# Patient Record
Sex: Female | Born: 1975
Health system: Southern US, Community
[De-identification: ages and names within clinical notes are randomized; demographics above are authoritative.]

## PROBLEM LIST (undated history)

## (undated) DIAGNOSIS — R112 Nausea with vomiting, unspecified: Secondary | ICD-10-CM

## (undated) DIAGNOSIS — K219 Gastro-esophageal reflux disease without esophagitis: Secondary | ICD-10-CM

## (undated) DIAGNOSIS — Z9889 Other specified postprocedural states: Secondary | ICD-10-CM

## (undated) HISTORY — DX: Other specified postprocedural states: Z98.890

## (undated) HISTORY — DX: Nausea with vomiting, unspecified: R11.2

## (undated) HISTORY — PX: TYMPANOSTOMY: SHX2586

## (undated) HISTORY — PX: WISDOM TOOTH EXTRACTION: SHX21

## (undated) HISTORY — DX: Gastro-esophageal reflux disease without esophagitis: K21.9

---

## 2003-01-13 ENCOUNTER — Other Ambulatory Visit: Admission: RE | Admit: 2003-01-13 | Discharge: 2003-01-13 | Payer: Self-pay | Admitting: *Deleted

## 2006-09-30 ENCOUNTER — Ambulatory Visit (HOSPITAL_COMMUNITY): Admission: RE | Admit: 2006-09-30 | Discharge: 2006-09-30 | Payer: Self-pay | Admitting: Obstetrics and Gynecology

## 2007-03-14 ENCOUNTER — Inpatient Hospital Stay (HOSPITAL_COMMUNITY): Admission: AD | Admit: 2007-03-14 | Discharge: 2007-03-14 | Payer: Self-pay | Admitting: Obstetrics and Gynecology

## 2007-07-12 ENCOUNTER — Ambulatory Visit (HOSPITAL_COMMUNITY): Admission: RE | Admit: 2007-07-12 | Discharge: 2007-07-12 | Payer: Self-pay | Admitting: Obstetrics and Gynecology

## 2007-08-06 ENCOUNTER — Ambulatory Visit (HOSPITAL_COMMUNITY): Admission: RE | Admit: 2007-08-06 | Discharge: 2007-08-06 | Payer: Self-pay | Admitting: Obstetrics and Gynecology

## 2007-11-29 ENCOUNTER — Inpatient Hospital Stay (HOSPITAL_COMMUNITY): Admission: RE | Admit: 2007-11-29 | Discharge: 2007-12-02 | Payer: Self-pay | Admitting: Obstetrics and Gynecology

## 2008-05-12 ENCOUNTER — Encounter: Admission: RE | Admit: 2008-05-12 | Discharge: 2008-07-13 | Payer: Self-pay | Admitting: Sports Medicine

## 2008-07-11 IMAGING — US US OB DETAIL+14 WK
1 series · 14 of 28 positions shown · non-contrast
Comparison: none

OBSTETRICAL ULTRASOUND:
 This ultrasound was performed in The [HOSPITAL], and the [facility] report will be stored to [REDACTED] PACS.

[Series 1: us ob detail+14 wk · 14 of 79 slices shown]
[im 3/79]
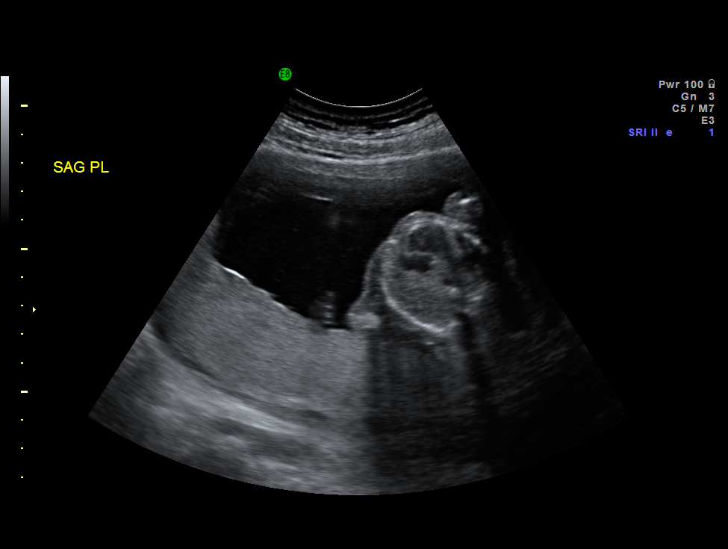
[im 9/79]
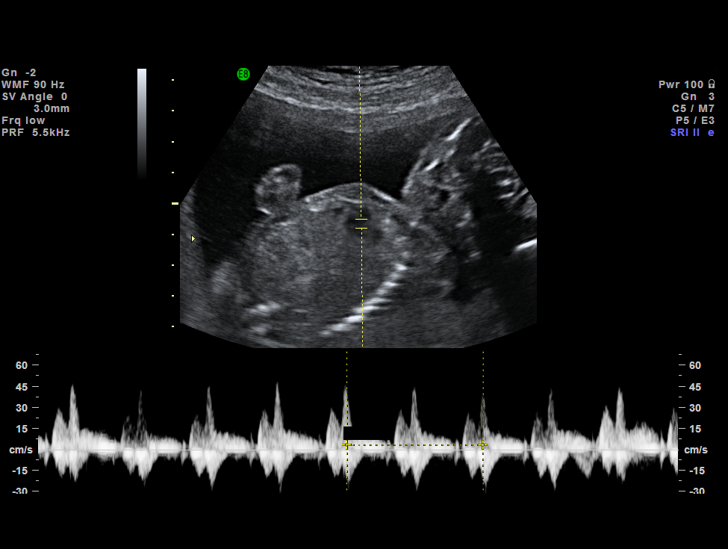
[im 15/79]
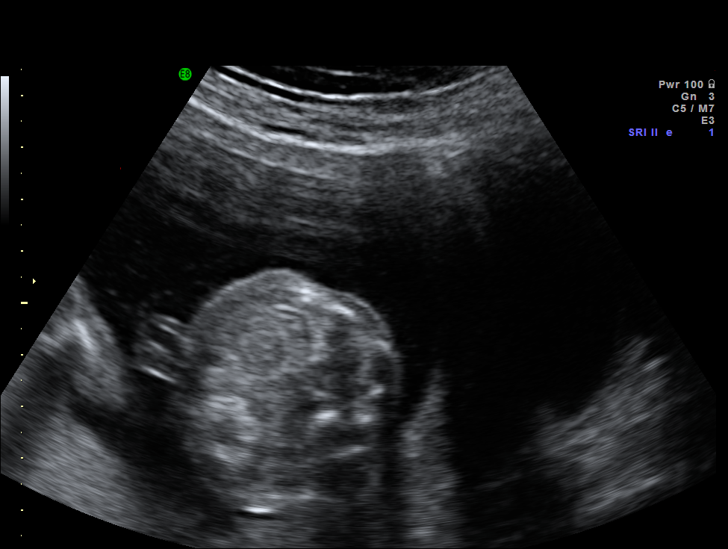
[im 21/79]
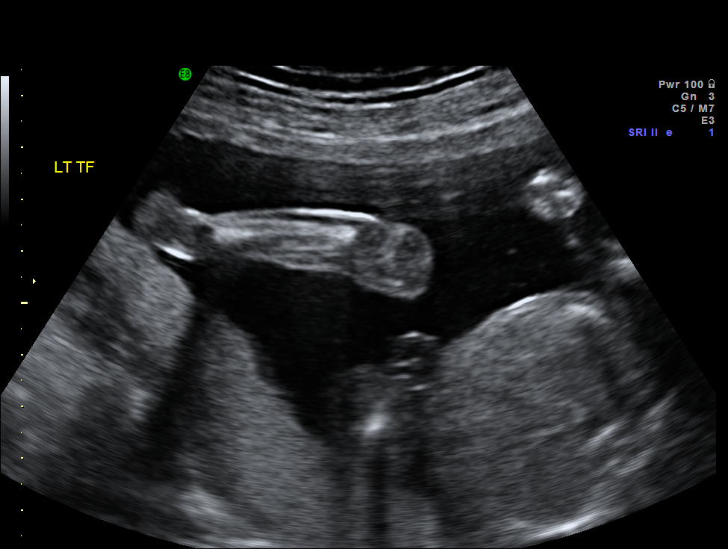
[im 27/79]
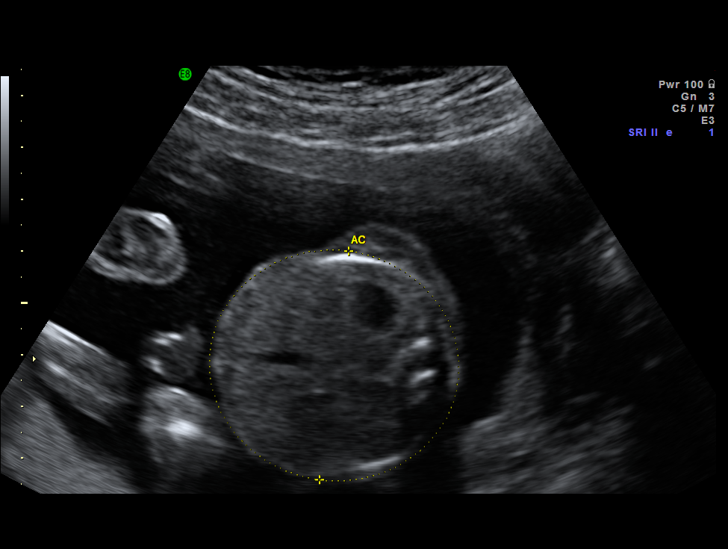
[im 32/79]
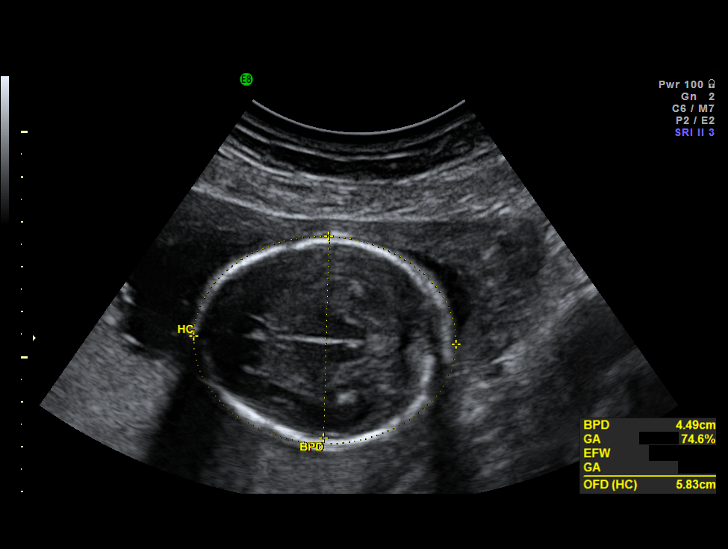
[im 38/79]
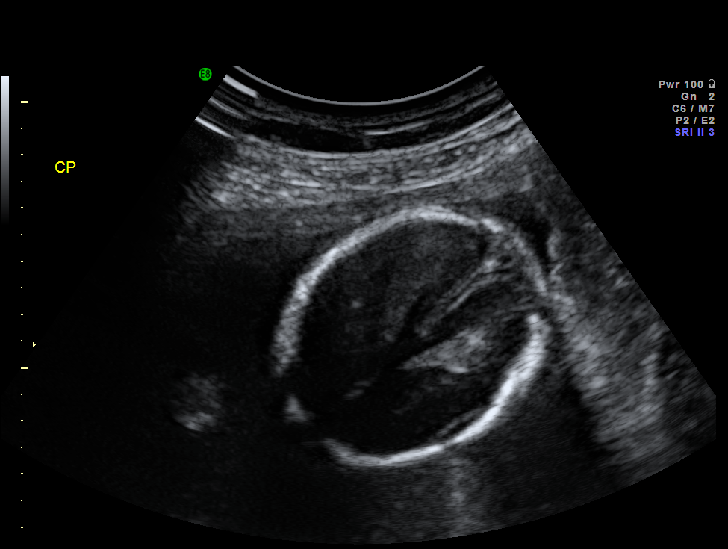
[im 44/79]
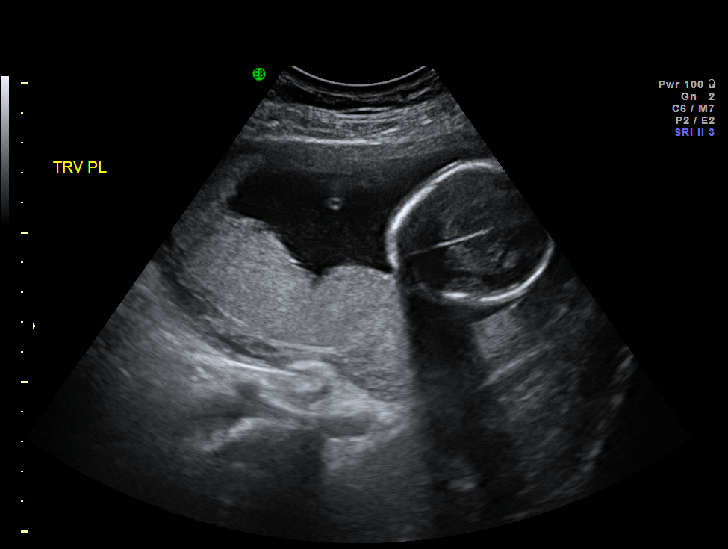
[im 50/79]
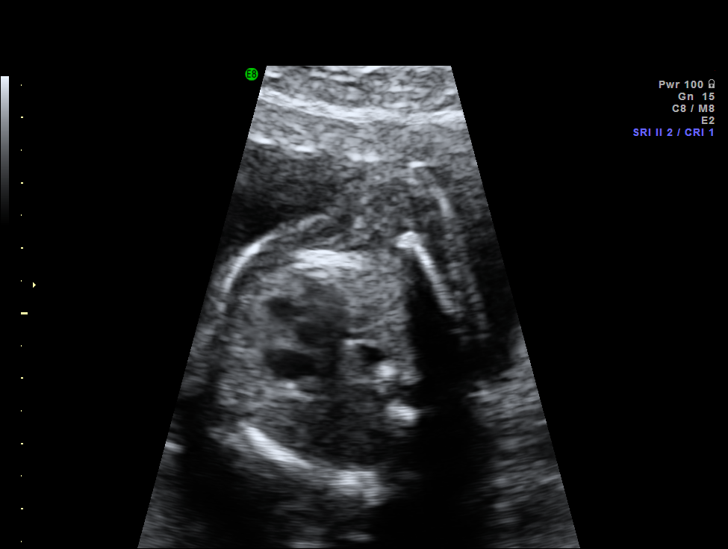
[im 55/79]
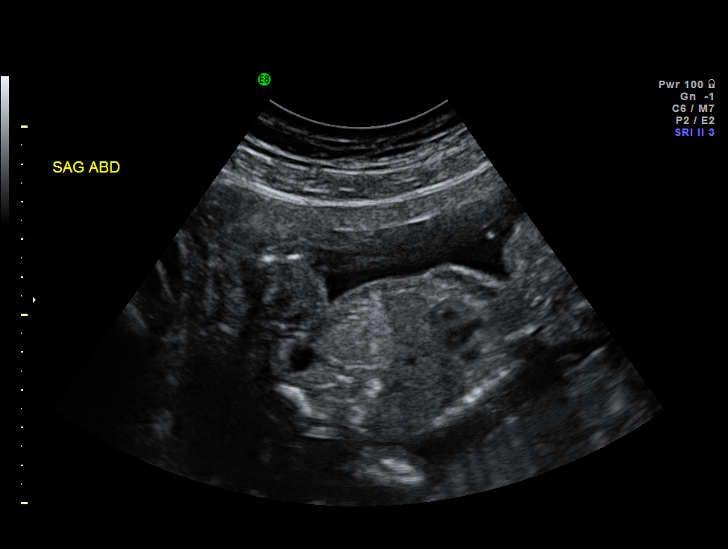
[im 61/79]
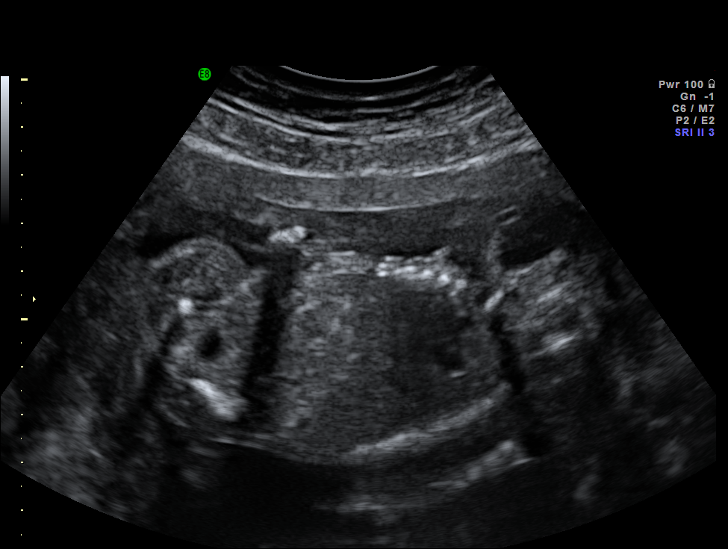
[im 67/79]
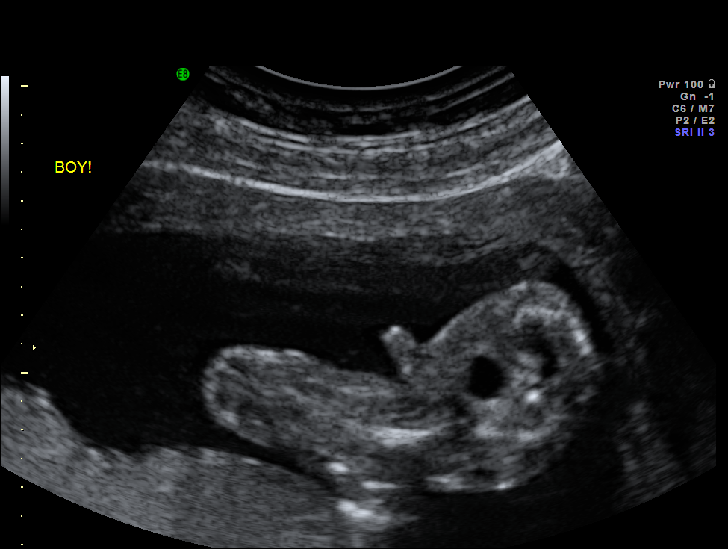
[im 73/79]
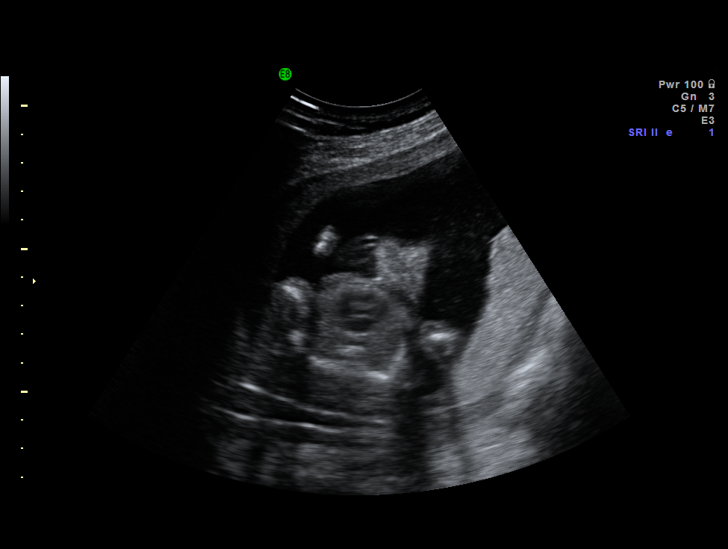
[im 79/79]
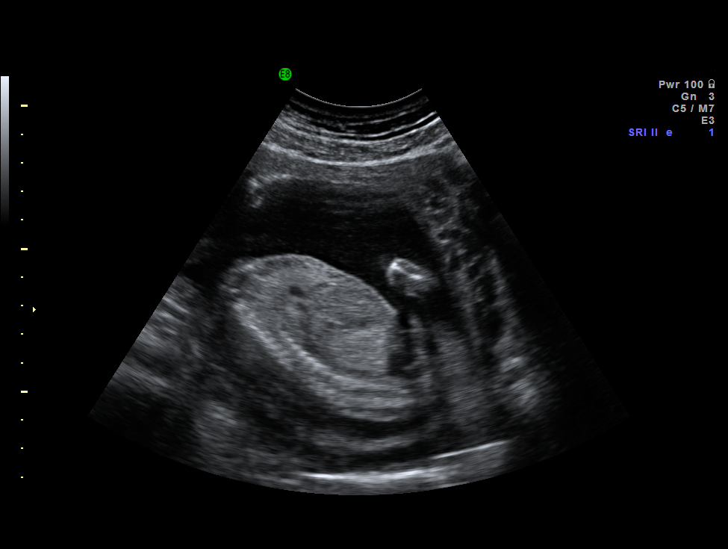

[14 of 28 positions shown; findings below may reference images not displayed]

IMPRESSION: The [facility] report has also been faxed to the ordering physician.

## 2010-05-03 ENCOUNTER — Inpatient Hospital Stay (HOSPITAL_COMMUNITY): Admission: RE | Admit: 2010-05-03 | Discharge: 2010-05-05 | Payer: Self-pay | Admitting: Obstetrics and Gynecology

## 2010-10-10 ENCOUNTER — Encounter: Payer: Self-pay | Admitting: Family Medicine

## 2010-11-04 ENCOUNTER — Ambulatory Visit: Payer: Self-pay | Admitting: Emergency Medicine

## 2011-01-08 ENCOUNTER — Ambulatory Visit
Admission: RE | Admit: 2011-01-08 | Discharge: 2011-01-08 | Payer: Self-pay | Source: Home / Self Care | Attending: Family Medicine | Admitting: Family Medicine

## 2011-01-08 DIAGNOSIS — D696 Thrombocytopenia, unspecified: Secondary | ICD-10-CM | POA: Insufficient documentation

## 2011-01-08 DIAGNOSIS — J301 Allergic rhinitis due to pollen: Secondary | ICD-10-CM | POA: Insufficient documentation

## 2011-01-08 DIAGNOSIS — K219 Gastro-esophageal reflux disease without esophagitis: Secondary | ICD-10-CM | POA: Insufficient documentation

## 2011-01-09 ENCOUNTER — Encounter: Payer: Self-pay | Admitting: Family Medicine

## 2011-01-10 LAB — CONVERTED CEMR LAB
MCHC: 33.9 g/dL (ref 30.0–36.0)
MCV: 87.8 fL (ref 78.0–100.0)
Platelets: 181 10*3/uL (ref 150–400)
RBC: 4.76 M/uL (ref 3.87–5.11)
RDW: 11.9 % (ref 11.5–15.5)

## 2011-01-12 ENCOUNTER — Telehealth: Payer: Self-pay | Admitting: Family Medicine

## 2011-01-15 ENCOUNTER — Ambulatory Visit
Admission: RE | Admit: 2011-01-15 | Discharge: 2011-01-15 | Payer: Self-pay | Source: Home / Self Care | Admitting: Emergency Medicine

## 2011-01-15 DIAGNOSIS — M79609 Pain in unspecified limb: Secondary | ICD-10-CM | POA: Insufficient documentation

## 2011-01-21 NOTE — Assessment & Plan Note (Signed)
Summary: cough-dry x 3 wks, sorethroat x 1wk rm 5   Vital Signs:  Patient Profile:   35 Years Old Female CC:      Cold & URI symptoms Height:     70 inches Weight:      137 pounds O2 Sat:      100 % O2 treatment:    Room Air Temp:     97.7 degrees F oral Pulse rate:   58 / minute Pulse rhythm:   regular Resp:     16 per minute BP sitting:   125 / 79  (left arm) Cuff size:   regular  Vitals Entered By: Areta Haber CMA (November 04, 2010 4:44 PM)                  Current Allergies: No known allergies History of Present Illness History from: patient Chief Complaint: Cold & URI symptoms History of Present Illness: Patient complains of onset of cold symptoms for 3 weeks.  They have been using Claritin & Delsym which is helping a little bit.  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  She has a history of allergic rhinitis, worse with ragweed. + sore throat x1 week + dry raspy cough + mild chest congestion No pleuritic pain No wheezing  nasal congestion + post-nasal drainage No sinus pain/pressure No itchy/red eyes No earache No hemoptysis No SOB No chills/sweats No fever No nausea No vomiting No abdominal pain No diarrhea No skin rashes No fatigue No myalgias No headache   Current Problems: UPPER RESPIRATORY INFECTION, ACUTE (ICD-465.9) FAMILY HISTORY OF CAD FEMALE 1ST DEGREE RELATIVE <50 (ICD-V17.3)   Current Meds DELSYM 30 MG/5ML LQCR (DEXTROMETHORPHAN POLISTIREX) as directed CLARITIN 10 MG TABS (LORATADINE) 1 tab by mouth once daily  REVIEW OF SYSTEMS Constitutional Symptoms      Denies fever, chills, night sweats, weight loss, weight gain, and fatigue.  Eyes       Denies change in vision, eye pain, eye discharge, glasses, contact lenses, and eye surgery. Ear/Nose/Throat/Mouth       Complains of sore throat.      Denies hearing loss/aids, change in hearing, ear pain, ear discharge, dizziness,  frequent runny nose, frequent nose bleeds, sinus problems, hoarseness, and tooth pain or bleeding.      Comments: x 1 wk Respiratory       Complains of dry cough.      Denies productive cough, wheezing, shortness of breath, asthma, bronchitis, and emphysema/COPD.  Cardiovascular       Denies murmurs, chest pain, and tires easily with exhertion.    Gastrointestinal       Denies stomach pain, nausea/vomiting, diarrhea, constipation, blood in bowel movements, and indigestion. Genitourniary       Denies painful urination, kidney stones, and loss of urinary control. Neurological       Denies paralysis, seizures, and fainting/blackouts. Musculoskeletal       Denies muscle pain, joint pain, joint stiffness, decreased range of motion, redness, swelling, muscle weakness, and gout.  Skin       Denies bruising, unusual mles/lumps or sores, and hair/skin or nail changes.  Psych       Denies mood changes, temper/anger issues, anxiety/stress, speech problems, depression, and sleep problems. Other Comments: x 3wks. Pt has not seen her PCP for this.   Past History:  Past Surgical History: Caesarean section  Family History: Family History of Arthritis Family History of CAD Female 1st  degree relative <50 Family History Hypertension Cirrhosis - mother Firefighter  Social History: Married Never Smoked Alcohol use-no Drug use-no Regular exercise-yes Smoking Status:  never Drug Use:  no Does Patient Exercise:  yes Physical Exam General appearance: well developed, well nourished, no acute distress Ears: normal, no lesions or deformities Nasal: mild clear drainage Oral/Pharynx: mild clear post nasal drip, mild bilateral tonsillar hypertrophy and erythema, no exudates, oropharnyx is widely patent Neck: mild bilateral anterior cervical lymphadenopathy Chest/Lungs: no rales, wheezes, or rhonchi bilateral, breath sounds equal without effort Heart: regular rate and  rhythm, no murmur Skin: no  obvious rashes or lesions MSE: oriented to time, place, and person Assessment New Problems: UPPER RESPIRATORY INFECTION, ACUTE (ICD-465.9) FAMILY HISTORY OF CAD FEMALE 1ST DEGREE RELATIVE <50 (ICD-V17.3)  Most like viral upper respiratory infection  Patient Education: Patient and/or caregiver instructed in the following: rest, fluids, Tylenol prn, Ibuprofen prn. Demonstrates willingness to comply.  Plan New Orders: New Patient Level II [99202] Rapid Strep [16109] Planning Comments:   1)  Rapid strep test is negative 2)  Use nasal saline solution (over the counter) at least 3 times a day. 3)  Use over the counter decongestants like Zyrtec-D every 12 hours as needed to help with congestion and post-nasal drip 4)  Can take tylenol every 6 hours or motrin every 8 hours for pain or fever. 5) If symptoms continue for another week or get worse, can consider a trial of antibiotics of cough meds in additional to normal symptomatic care.    The patient and/or caregiver has been counseled thoroughly with regard to medications prescribed including dosage, schedule, interactions, rationale for use, and possible side effects and they verbalize understanding.  Diagnoses and expected course of recovery discussed and will return if not improved as expected or if the condition worsens. Patient and/or caregiver verbalized understanding.   Orders Added: 1)  New Patient Level II [99202] 2)  Rapid Strep [60454]    Laboratory Results  Date/Time Received: November 04, 2010 5:07 PM  Date/Time Reported: November 04, 2010 5:07 PM   Other Tests  Rapid Strep: negative  Kit Test Internal QC: Negative   (Normal Range: Negative)   Appended Document: cough-dry x 3 wks, sorethroat x 1wk rm 5 Pt was neg for strep yesterday but is feeling worse today with sinus pressure, aches, pains and hoarsenss with underlying allergy flare x 3 wks.  Will go ahead and treat iwth Amoxicillin 875 mg two times a day x 10  days, #20 , zero RFs sent to pharmacy + supportive care measures.  Seymour Bars, D.O.  Appended Document: cough-dry x 3 wks, sorethroat x 1wk rm 5

## 2011-01-23 NOTE — Letter (Signed)
Summary: Health Screening  Health Screening   Imported By: Lester [place] 01/16/2011 08:23:54  _____________________________________________________________________  External Attachment:    Type:   Image     Comment:   External Document

## 2011-01-23 NOTE — Assessment & Plan Note (Signed)
Summary: RIGHT PINKY TOE POSSIBLE BREAK room 5   Vital Signs:  Patient Profile:   35 Years Old Female CC:      injury RT 5th digit  Height:     70 inches (177.80 cm) O2 Sat:      98 % O2 treatment:    Room Air Temp:     98.2 degrees F oral Pulse rate:   84 / minute Resp:     16 per minute BP sitting:   116 / 74  (left arm) Cuff size:   regular  Vitals Entered By: Clemens Catholic LPN (January 15, 2011 9:32 AM)                  Updated Prior Medication List: MULTIVITAMINS  TABS (MULTIPLE VITAMIN) once daily KARIVA 0.15-0.02/0.01 MG (21/5) TABS (DESOGESTREL-ETHINYL ESTRADIOL) use as directed  Current Allergies (reviewed today): No known allergies History of Present Illness History from: patient Chief Complaint: injury RT 5th digit  History of Present Illness: Accidently kicked the side R foot (not wearing shoes at the time) against the wall yesterday.  Had pain.  Then felt that her 5th toe was swelling later in the day while wearing shoes.  Became bruised and tender.  Not using any meds.  Came to clinic for evaluation.  No history of toe pain or trauma other than this episode.  REVIEW OF SYSTEMS Constitutional Symptoms      Denies fever, chills, night sweats, weight loss, weight gain, and fatigue.  Eyes       Denies change in vision, eye pain, eye discharge, glasses, contact lenses, and eye surgery. Ear/Nose/Throat/Mouth       Denies hearing loss/aids, change in hearing, ear pain, ear discharge, dizziness, frequent runny nose, frequent nose bleeds, sinus problems, sore throat, hoarseness, and tooth pain or bleeding.  Respiratory       Denies dry cough, productive cough, wheezing, shortness of breath, asthma, bronchitis, and emphysema/COPD.  Cardiovascular       Denies murmurs, chest pain, and tires easily with exhertion.    Gastrointestinal       Denies stomach pain, nausea/vomiting, diarrhea, constipation, blood in bowel movements, and indigestion. Genitourniary  Denies painful urination, kidney stones, and loss of urinary control. Neurological       Denies paralysis, seizures, and fainting/blackouts. Musculoskeletal       Denies muscle pain, joint pain, joint stiffness, decreased range of motion, redness, swelling, muscle weakness, and gout.  Skin       Denies bruising, unusual mles/lumps or sores, and hair/skin or nail changes.  Psych       Denies mood changes, temper/anger issues, anxiety/stress, speech problems, depression, and sleep problems. Other Comments: pt c/o RT 5th digit injury xyesterday AM. she ran into the wall. pain, bruising and swelling.   Past History:  Past Medical History: Unremarkable  Past Surgical History: Reviewed history from 01/08/2011 and no changes required. Caesarean section x 2 4 wisdom teeth impacted surgically removed. Tympanostomy tubes  Family History: Reviewed history from 01/08/2011 and no changes required. Family History of Arthritis Family History of CAD Female 1st degree relative <50 Family History Hypertension Cirrhosis - mother Father-Parkinson Father: 51, Parkinson Disease: diagnosed roughly 5-10 years. Mother: 39, cryptogenic cirrhosis diagnosed 15 years ago, Migraines, CAD stent at 60, CHF, pacer, soon to have defibrillator placed, Rheumatoid Arthritis diagnosed 15 years ago, thrombocytopenia, pulmonary edema, lung disease possible lymphoma, HTN, reflux Siblings:  Sister: 70, Migraines Sister: 109: Severe Gerd MGM: deceased in 41s, Massive  MI MGF: deceased in 82s, severe GERD and esophageal cancer, CAD, bypass at 70, DM well controlled, HTN PGM: deceased in 10s PGF: deceased in 57s, DM Children: Son: 3yo, A&W Daughter: 8 mn, A&W M Aunt: 51s, CAD first stents in 9s 28M Uncles: bypass in 13s  Social History: Reviewed history from 01/08/2011 and no changes required. 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  Ecchymoses and mild swelling entire base of toe.  TTP at MTP.  No pain along shaft or base of 5th MC. Assessment New Problems: TOE PAIN (ICD-729.5)   Plan New Orders: Est. 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 Radiology read Xray as normal.  However there may be a small crack and raised periosteum of the distal 5th MC seen best on the oblique film.  I discussed this with Pernella who agrees.  Will treat as toe fracture for the first 2 weeks including frequent ice, elevation, rest, Motrin as needed, and firm-soled shoe to take pressure off while walking.  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  Should expect mild throbbing (perhaps even for a few weeks), but if pain and swelling and bruising not improving, will have her follow up with Dr. Margaretha Sheffield in sports medicine.   The patient and/or caregiver has been counseled thoroughly with regard to medications prescribed including dosage, schedule, interactions, rationale for use, and possible side effects and they verbalize understanding.  Diagnoses and expected course of recovery discussed and will return if not improved as expected or if the condition worsens. Patient and/or caregiver verbalized understanding.   Orders Added: 1)  Est. Patient Level II [99212] 2)  T-DG Toe 5th*R* [73660]

## 2011-01-23 NOTE — Progress Notes (Signed)
Summary: N/V  Phone Note Call from Patient   Caller: Spouse Summary of Call: c/o N/V x 1 day.  Infant daughter has gastroenteritis.  Will send in RX for Phenergan suppositories and Zofran ODT tabs for N/V in addition to fluid rehydration.   Initial call taken by: Seymour Bars DO,  January 12, 2011 12:34 PM    New/Updated Medications: ZOFRAN ODT 8 MG TBDP (ONDANSETRON) 1 tab by mouth sublingual q 8 hrs as needed nausea PROMETHAZINE HCL 25 MG SUPP (PROMETHAZINE HCL) 1 suppository PR q 6 hrs as needed nausea Prescriptions: PROMETHAZINE HCL 25 MG SUPP (PROMETHAZINE HCL) 1 suppository PR q 6 hrs as needed nausea  #12 x 0   Entered and Authorized by:   Seymour Bars DO   Signed by:   Seymour Bars DO on 01/12/2011   Method used:   Electronically to        [facility] [phone]* (retail)       [address] [place], [place]  [address]       Ph: [phone] or [phone]       Fax: [phone]   RxID:   [id number] ZOFRAN ODT 8 MG TBDP (ONDANSETRON) 1 tab by mouth sublingual q 8 hrs as needed nausea  #15 x 0   Entered and Authorized by:   Seymour Bars DO   Signed by:   Seymour Bars DO on 01/12/2011   Method used:   Electronically to        [facility] [phone]* (retail)       [address] [place], [place]  [address]       Ph: [phone] or [phone]       Fax: [phone]   RxID:   [id number]

## 2011-01-23 NOTE — Assessment & Plan Note (Signed)
Summary: routine exam/vfw   Vital Signs:  Patient profile:   35 year old female Menstrual status:  regular LMP:     01/06/2011 Height:      70 inches (177.80 cm) Weight:      138.50 pounds (62.95 kg) BMI:     19.94 O2 Sat:      97 % on Room air Temp:     97.8 degrees F (36.56 degrees C) oral Pulse rate:   81 / minute BP sitting:   120 / 83  (right arm) Cuff size:   regular  Vitals Entered By: Josph Macho RMA (January 08, 2011 1:59 PM)  O2 Flow:  Room air CC: Discuss Platlets, low/ CF Is Patient Diabetic? No LMP (date): 01/06/2011     Menstrual Status regular Enter LMP: 01/06/2011   History of Present Illness: Patient is a 35 yo Caucasian female in today to establish care. She is generally in good health but has a few concerns. today. She has been monitored in both of her pregnancies for low platelets. She never had any bleeding problems and was able to proceed with elective caesarean sections both times because the numbers were acceptable. After the first pregnancy the thrombocytopenia resolved but after her last pregnancy, which delivered 8 months ago the thrombocytopenia persisted. She had labs drawn in October which showed mild thrombocytopenia with a platelet count of 135. She has not noted any excessive bruising, bleeding gums or other concerning signs of low platelets but does note that her mother is also noted to have a low platelet count.  Her other persistent health concern is reflux. She reports she is largely able to control it with diet but that at times it can be severe enough to awaken her from sleep and cause vomitting. This is more likely to happen if she eats a fatty, spicey or large meal in the evening. Most weeks she only needs to take a Tums maybe once a week. No other concerns noted. No recent illness, fevers, chills, CP, SOB, palp, GI or GU c/o.  Current Problems (verified): 1)  Allergic Rhinitis, Seasonal  (ICD-477.0) 2)  Gerd  (ICD-530.81) 3)   Thrombocytopenia  (ICD-287.5) 4)  Family History of Cad Female 1st Degree Relative <50  (ICD-V17.3)  Current Medications (verified): 1)  Multivitamins  Tabs (Multiple Vitamin) .... Once Daily 2)  Kariva 0.15-0.02/0.01 Mg (21/5) Tabs (Desogestrel-Ethinyl Estradiol) .... Use As Directed  Allergies (verified): No Known Drug Allergies  Past History:  Family History: Last updated: 01/08/2011 Family History of Arthritis Family History of CAD Female 1st degree relative <50 Family History Hypertension Cirrhosis - mother Father-Parkinson Father: 63, Parkinson Disease: diagnosed roughly 5-10 years. Mother: 43, cryptogenic cirrhosis diagnosed 15 years ago, Migraines, CAD stent at 62, CHF, pacer, soon to have defibrillator placed, Rheumatoid Arthritis diagnosed 15 years ago, thrombocytopenia, pulmonary edema, lung disease possible lymphoma, HTN, reflux Siblings:  Sister: 65, Migraines Sister: 32: Severe Gerd MGM: deceased in 28s, Massive MI MGF: deceased in 42s, severe GERD and esophageal cancer, CAD, bypass at 55, DM well controlled, HTN PGM: deceased in 69s PGF: deceased in 23s, DM Children: Son: 3yo, A&W Daughter: 8 mn, A&W M Aunt: 74s, CAD first stents in 37s 32M Uncles: bypass in 27s  Social History: Last updated: 01/08/2011 Married Never Smoked Alcohol use-no Drug use-no Regular exercise-yes 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 Tympanostomy tubes  Family History: Family History of Arthritis Family History of CAD Female 1st degree relative <50 Family History Hypertension Cirrhosis - mother Father-Parkinson Father: 54, Parkinson Disease: diagnosed roughly 5-10 years. Mother: 42, cryptogenic cirrhosis diagnosed 15 years ago,  Migraines, CAD stent at 39, CHF, pacer, soon to have defibrillator placed, Rheumatoid Arthritis diagnosed 15 years ago, thrombocytopenia, pulmonary edema, lung disease possible lymphoma, HTN, reflux Siblings:  Sister: 87, Migraines Sister: 24: Severe Gerd MGM: deceased in 42s, Massive MI MGF: deceased in 54s, severe GERD and esophageal cancer, CAD, bypass at 5, DM well controlled, HTN PGM: deceased in 84s PGF: deceased in 42s, DM Children: Son: 3yo, A&W Daughter: 8 mn, A&W M Aunt: 62s, CAD first stents in 36s 66M Uncles: bypass in 63s  Social History: Married Never Smoked Alcohol use-no Drug use-no Regular exercise-yes 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    Physical Exam  General:  Well-developed,well-nourished,in no acute distress; alert,appropriate and cooperative throughout examination Head:  Normocephalic and atraumatic without obvious abnormalities. No apparent alopecia or balding. Eyes:  No corneal or conjunctival inflammation noted. EOMI. Perrla. Ears:  External ear exam shows no significant lesions or deformities.  Otoscopic examination reveals clear canals, tympanic membranes are intact bilaterally without bulging, retraction, inflammation or discharge. Hearing is grossly normal bilaterally. Nose:  External nasal examination shows no deformity or inflammation. Nasal mucosa are pink and moist without lesions or  exudates. Mouth:  Oral mucosa and oropharynx without lesions or exudates.  Teeth in good repair. Neck:  No deformities, masses, or tenderness noted. Lungs:  Normal respiratory effort, chest expands symmetrically. Lungs are clear to auscultation, no crackles or wheezes. Heart:  Normal rate and regular rhythm. S1 and S2 normal without gallop, murmur, click, rub or other extra sounds. Abdomen:  Bowel sounds positive,abdomen soft and non-tender without masses, organomegaly or hernias noted. Msk:  No deformity or scoliosis noted of thoracic or lumbar spine.   Pulses:  R and L carotid, dorsalis pedis and posterior tibial pulses are full and equal bilaterally Extremities:  No clubbing, cyanosis, edema, or deformity noted with normal full range of motion of all joints.   Neurologic:  No cranial nerve deficits noted. Station and gait are normal. Plantar reflexes are down-going bilaterally. DTRs are symmetrical throughout. Sensory, motor and coordinative functions appear intact. Skin:  Intact without suspicious lesions or rashes Cervical Nodes:  No lymphadenopathy noted Psych:  Cognition and judgment appear intact. Alert and cooperative with normal attention span and concentration. No apparent delusions, illusions, hallucinations   Impression & Recommendations:  Problem # 1:  THROMBOCYTOPENIA (ICD-287.5)  Orders: T-CBC No Diff ([id number]) Will repeat labs and monitor would recommend CBC roughly every 3-4 months for next 2 blood draws then if numbers remain stable would revert to every 6-7 months after that. Patient aware of S/S of dropping platelets and will call if these occur  Problem # 2:  GERD (ICD-530.81) Mild symptoms, will continue to use Tums as needed and avoid offending foods. Did recommend the use of ginger tea or  supplements for occasional nausea. Call with any concerns.  Problem # 3:  Preventive Health Care (ICD-V70.0) Sees her OB/GYN for ongoing paps. Works in the [employer] system so has  annual labs drawn by her job, her labs from 10/11 are reviewed today and other than the low platelets are WNL. Will review annually or draw further labs only as indicated. Patient to call with any concerns.   Problem # 4:  ALLERGIC RHINITIS, SEASONAL (ICD-477.0) No c/o today and well treated with OTC Claritin as needed, may continue the same therapy and call with any concerns  Complete Medication List: 1)  Multivitamins Tabs (Multiple vitamin) .... Once daily 2)  Kariva 0.15-0.02/0.01 Mg (21/5) Tabs (Desogestrel-ethinyl estradiol) .... Use as directed  Patient Instructions: 1)  Please schedule a follow-up appointment in 1 year as indicated for annual exam. 2)  Please schedule a follow-up appointment as needed .  3)  Consider ginger products as needed for nausea.   Orders Added: 1)  T-CBC No Diff [[id number]] 2)  New Patient 18-39 years [99385]         [facility]      G: 2  P: 2        LMP: 01/06/2011      Comments on pregnancies: 2 C/S

## 2011-01-29 ENCOUNTER — Encounter: Payer: Self-pay | Admitting: Family Medicine

## 2011-03-11 LAB — CBC
HCT: 27.7 % — ABNORMAL LOW (ref 36.0–46.0)
Hemoglobin: 9.6 g/dL — ABNORMAL LOW (ref 12.0–15.0)
MCV: 91.2 fL (ref 78.0–100.0)
Platelets: 137 10*3/uL — ABNORMAL LOW (ref 150–400)
RBC: 3 MIL/uL — ABNORMAL LOW (ref 3.87–5.11)
RBC: 3.99 MIL/uL (ref 3.87–5.11)
RDW: 13 % (ref 11.5–15.5)
WBC: 11.3 10*3/uL — ABNORMAL HIGH (ref 4.0–10.5)
WBC: 12.2 10*3/uL — ABNORMAL HIGH (ref 4.0–10.5)

## 2011-03-11 LAB — RPR: RPR Ser Ql: NONREACTIVE

## 2011-05-06 NOTE — Op Note (Signed)
NAME:  Brittany Mckenzie, Brittany Mckenzie          ACCOUNT NO.:  [id number]   MEDICAL RECORD NO.:  [id number]          PATIENT TYPE:  INP   LOCATION:  9107                          FACILITY:  [facility]   PHYSICIAN:  Lenoard Aden, M.D.DATE OF BIRTH:  October 25, 1976   DATE OF PROCEDURE:  11/29/2007  DATE OF DISCHARGE:                               OPERATIVE REPORT   PREOPERATIVE DIAGNOSIS:  A 39-week intrauterine pregnancy, frank breech,  oligohydramnios.   POSTOPERATIVE DIAGNOSIS:  A 39-week intrauterine pregnancy, frank  breech, oligohydramnios.   PROCEDURE:  Primary low segment transverse cesarean section.   SURGEON:  Lenoard Aden, M.D.   ASSISTANT:  Maxie Better, M.D.   ANESTHESIA:  Spinal by Jean Rosenthal.   ESTIMATED BLOOD LOSS:  1000-1200 mL.   COMPLICATIONS:  None.   DRAINS:  Foley.   COUNTS:  Correct.   Patient to recovery in good condition.   BRIEF OP NOTE:  After being apprised risks of anesthesia, infection,  bleeding, injury to abdominal organs, need for repair, delayed versus  immediate complications to include bowel and bladder injury, patient  brought to the operating where she was administered spinal anesthetic  without complications, prepped draped usual sterile fashion.  Foley  catheter placed after achieving adequate anesthesia.  Dilute Marcaine  solution placed.  Pfannenstiel skin incision made with scalpel, carried  down to fascia which nicked in midline, opened and transversely using  Mayo scissors.  Rectus muscles dissected sharply midline, peritoneum  entered sharply.  Bladder blade placed.  Visceral peritoneum scored  sharply off the lower uterine segment.  Kerr hysterotomy incision made,  extended in an anterior to posterior direction.  At this time atraumatic  delivery of a frank breech presenting female, handed pediatricians  attendance using the usual maneuvers with flexion of the fetal vertex,  Apgars 08/09.  Placenta delivered from posterior location intact  three-  vessel cord noted.  Uterus curetted using a dry lap pack and found to be  normal contour, normal tubes, normal ovaries with a small right  paratubal cyst noted.  The uterus was then closed in two running  imbricating layers of 0 Monocryl suture.  Good hemostasis noted.  Interrupted suture placed to right lateral  portion.  Bladder flap inspected, found be hemostatic.  Irrigation  accomplished.  Peritoneum closed using a 2-0 plain in a continuous  running fashion.  Fascia then closed using 0 Monocryl continuous running  fashion.  Skin closed using staples.  The patient tolerated procedure  well and is transferred to the recovery room in good condition.      Lenoard Aden, M.D.  Electronically Signed     RJT/MEDQ  D:  11/29/2007  T:  11/30/2007  Job:  [id number]

## 2011-05-09 NOTE — Discharge Summary (Signed)
NAME:  Brittany Mckenzie, Brittany Mckenzie          ACCOUNT NO.:  [id number]   MEDICAL RECORD NO.:  [id number]          PATIENT TYPE:  INP   LOCATION:  9107                          FACILITY:  [facility]   PHYSICIAN:  Lenoard Aden, M.D.DATE OF BIRTH:  1976/10/28   DATE OF ADMISSION:  11/29/2007  DATE OF DISCHARGE:  12/02/2007                               DISCHARGE SUMMARY   HISTORY OF PRESENT ILLNESS:  The patient underwent uncomplicated primary  Cesarean section for breech without complications. Tolerated regular  diet well.   DISPOSITION:  Discharge to home day 3. Discharge teaching done.   DISCHARGE MEDICATIONS:  1. Tylox.  2. Prenatal vitamins.  3. Iron.   FOLLOWUP:  In the office in 4 to 6 weeks.      Lenoard Aden, M.D.  Electronically Signed     RJT/MEDQ  D:  01/18/2008  T:  01/18/2008  Job:  [id number]

## 2011-06-19 ENCOUNTER — Telehealth: Payer: Self-pay | Admitting: Family Medicine

## 2011-06-19 MED ORDER — PROCHLORPERAZINE MALEATE 5 MG PO TABS: ORAL_TABLET | ORAL | Status: DC

## 2011-06-19 NOTE — Telephone Encounter (Signed)
Pt called with 1 day of gastroenteritis symptoms.  She is on vacation.  Will send in Rx for Compazine.  Sipping on clears.

## 2011-09-29 LAB — CBC
HCT: 29.2 — ABNORMAL LOW
HCT: 39.7
Hemoglobin: 13.6
MCHC: 34.4
MCHC: 34.7
MCV: 93.4
Platelets: 133 — ABNORMAL LOW
RBC: 4.27
RDW: 13
WBC: 13.2 — ABNORMAL HIGH

## 2011-10-06 LAB — TORCH-IGM(TOXO/ RUB/ CMV/ HSV) W TITER
CMV IgM: 0.75 IV
Toxoplasma IgM: 0.53 IV

## 2011-10-06 LAB — TORCH TITERS-IGG(TOXO/ RUB/ CMV/ HSV)
CMV IgG: >13.2 IV
Rubella IgG Scr: 19 IU/mL
Toxoplasma IgG Antibody (EIA): <5 IU/mL

## 2013-03-16 ENCOUNTER — Other Ambulatory Visit: Payer: Self-pay | Admitting: Sports Medicine

## 2013-03-16 ENCOUNTER — Encounter: Payer: Self-pay | Admitting: Sports Medicine

## 2013-03-16 MED ORDER — ONDANSETRON 8 MG PO TBDP: 8.0000 mg | ORAL_TABLET | Freq: Three times a day (TID) | ORAL | Status: DC | PRN

## 2013-03-16 MED ORDER — PROMETHAZINE HCL 25 MG PO TABS: 25.0000 mg | ORAL_TABLET | Freq: Four times a day (QID) | ORAL | Status: DC | PRN

## 2013-03-16 NOTE — Progress Notes (Signed)
Viral sounding gastroenteritis. Significant nausea and vomiting. Calling in Phenergan and Zofran.

## 2014-05-26 ENCOUNTER — Other Ambulatory Visit: Payer: Self-pay | Admitting: Internal Medicine

## 2014-07-19 ENCOUNTER — Telehealth: Payer: Self-pay | Admitting: Physician Assistant

## 2014-07-19 MED ORDER — EFINACONAZOLE 10 % EX SOLN: CUTANEOUS | Status: DC

## 2014-07-19 NOTE — Telephone Encounter (Signed)
Pt calls in with harden left great toenail with white to brown discoloration. Would like to try Jublia. Pt is a provider in the office. Not tried anything else to make better.

## 2014-11-09 ENCOUNTER — Other Ambulatory Visit: Payer: Self-pay | Admitting: *Deleted

## 2014-11-09 DIAGNOSIS — R238 Other skin changes: Secondary | ICD-10-CM

## 2014-11-13 LAB — HERPES SIMPLEX VIRUS CULTURE: ORGANISM ID, BACTERIA: NOT DETECTED

## 2014-11-24 ENCOUNTER — Ambulatory Visit (INDEPENDENT_AMBULATORY_CARE_PROVIDER_SITE_OTHER): Payer: 59 | Admitting: Physician Assistant

## 2014-11-24 ENCOUNTER — Encounter: Payer: Self-pay | Admitting: Physician Assistant

## 2014-11-24 VITALS — BP 119/77 | HR 84 | Ht 69.5 in | Wt 144.0 lb

## 2014-11-24 DIAGNOSIS — M6248 Contracture of muscle, other site: Secondary | ICD-10-CM

## 2014-11-24 DIAGNOSIS — M62838 Other muscle spasm: Secondary | ICD-10-CM

## 2014-11-24 DIAGNOSIS — R519 Headache, unspecified: Secondary | ICD-10-CM

## 2014-11-24 DIAGNOSIS — M542 Cervicalgia: Secondary | ICD-10-CM

## 2014-11-24 DIAGNOSIS — R51 Headache: Secondary | ICD-10-CM

## 2014-11-24 MED ORDER — CYCLOBENZAPRINE HCL 10 MG PO TABS: ORAL_TABLET | ORAL | Status: DC

## 2014-11-24 NOTE — Progress Notes (Signed)
   Subjective:    Patient ID: Brittany Mckenzie, female    DOB: Jun 17, 1976, 38 y.o.   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 She presents with left-sided neck pain today. She reports the pain has been going on for the last 3 weeks and has started to create daily tension like headaches. She describes the headaches as feeling like a band around her head squeezing. She does not feel like these headaches affect her vision or any pain behind her eyes. She does occasionally take Aleve and seems to help minimally. She has a history of neck pain for years but this has been much worse over the past 3 weeks. She has never had any imaging done. The pain and headache is the best in the morning and as the day goes on seems to build on the left side. Sometimes she will experience sharp pains with movement of her neck. She denies any numbness or tingling into her arms or fingers.   Review of Systems  All other systems reviewed and are negative.      Objective:   Physical Exam  Constitutional: She appears well-developed and well-nourished.  HENT:  Head: Normocephalic and atraumatic.  Musculoskeletal:  Normal ROM of neck today. She did have some hesitancy for full ROM to the left and that is when she feels discomfort.  No tenderness over c-spine to palpation.  Point tenderness at the base of left occipital region.  Tightness over trapezius muscle of upper back.           Assessment & Plan:   headaches/left sided muscle spasm- discussed sounded like some of pain is at a insertion point of muscle at occiptal bone. Trial of PT and flexeril at bedtime. Discussed to continue heating pad and aleve as needed. Consider massage directed at upper shoulders and neck. Discussed evaluation of positions and how she works to identify any triggers. Will hold off on imaging but suggested to consider if pain does not improve.

## 2014-12-13 ENCOUNTER — Ambulatory Visit (INDEPENDENT_AMBULATORY_CARE_PROVIDER_SITE_OTHER): Payer: 59 | Admitting: Physical Therapy

## 2014-12-13 DIAGNOSIS — M6248 Contracture of muscle, other site: Secondary | ICD-10-CM

## 2014-12-13 DIAGNOSIS — R51 Headache: Secondary | ICD-10-CM

## 2014-12-13 DIAGNOSIS — M542 Cervicalgia: Secondary | ICD-10-CM

## 2014-12-13 DIAGNOSIS — M2560 Stiffness of unspecified joint, not elsewhere classified: Secondary | ICD-10-CM

## 2014-12-20 ENCOUNTER — Encounter (INDEPENDENT_AMBULATORY_CARE_PROVIDER_SITE_OTHER): Payer: 59 | Admitting: Physical Therapy

## 2014-12-20 DIAGNOSIS — R51 Headache: Secondary | ICD-10-CM

## 2014-12-20 DIAGNOSIS — M542 Cervicalgia: Secondary | ICD-10-CM

## 2014-12-20 DIAGNOSIS — M2560 Stiffness of unspecified joint, not elsewhere classified: Secondary | ICD-10-CM

## 2014-12-20 DIAGNOSIS — M6248 Contracture of muscle, other site: Secondary | ICD-10-CM

## 2014-12-27 ENCOUNTER — Encounter (INDEPENDENT_AMBULATORY_CARE_PROVIDER_SITE_OTHER): Payer: 59 | Admitting: Physical Therapy

## 2014-12-27 DIAGNOSIS — M542 Cervicalgia: Secondary | ICD-10-CM

## 2014-12-27 DIAGNOSIS — M2560 Stiffness of unspecified joint, not elsewhere classified: Secondary | ICD-10-CM

## 2014-12-27 DIAGNOSIS — R51 Headache: Secondary | ICD-10-CM

## 2014-12-27 DIAGNOSIS — M6248 Contracture of muscle, other site: Secondary | ICD-10-CM

## 2015-04-16 ENCOUNTER — Ambulatory Visit (INDEPENDENT_AMBULATORY_CARE_PROVIDER_SITE_OTHER): Payer: 59

## 2015-04-16 ENCOUNTER — Other Ambulatory Visit: Payer: Self-pay | Admitting: Physician Assistant

## 2015-04-16 DIAGNOSIS — S99921A Unspecified injury of right foot, initial encounter: Secondary | ICD-10-CM

## 2015-04-16 DIAGNOSIS — M25571 Pain in right ankle and joints of right foot: Secondary | ICD-10-CM | POA: Diagnosis not present

## 2015-04-16 NOTE — Progress Notes (Signed)
Pt presents after great right toe injury yesterday dropping heavy object on toe. Bruising and swelling of great right toe. [place] wants to confirm no fracture.

## 2015-06-27 ENCOUNTER — Ambulatory Visit (INDEPENDENT_AMBULATORY_CARE_PROVIDER_SITE_OTHER): Payer: 59 | Admitting: Sports Medicine

## 2015-06-27 ENCOUNTER — Encounter: Payer: Self-pay | Admitting: Sports Medicine

## 2015-06-27 VITALS — BP 112/70 | HR 92 | Temp 98.4°F | Wt 144.0 lb

## 2015-06-27 DIAGNOSIS — J020 Streptococcal pharyngitis: Secondary | ICD-10-CM

## 2015-06-27 DIAGNOSIS — J029 Acute pharyngitis, unspecified: Secondary | ICD-10-CM

## 2015-06-27 LAB — POCT RAPID STREP A (OFFICE): Rapid Strep A Screen: POSITIVE — AB

## 2015-06-27 MED ORDER — PENICILLIN G BENZATHINE 1200000 UNIT/2ML IM SUSP
1.2000 10*6.[IU] | Freq: Once | INTRAMUSCULAR | Status: AC
  Administered 2015-06-27: 1.2 10*6.[IU] via INTRAMUSCULAR

## 2015-06-27 NOTE — Assessment & Plan Note (Signed)
Penicillin 1.2 million units intramuscular given.

## 2015-06-27 NOTE — Progress Notes (Signed)
Pt presents with sore throat and fever. She reports that she took her temp last night it was 101.8. She has been taking aleve for the fever. She also stated that she has felt nauseated, headache and chills.  Rapid strep done it was positive. Bicillin l-a 1.2 given. Pt tolerated well.Audelia Hives [place]

## 2015-09-28 ENCOUNTER — Other Ambulatory Visit: Payer: Self-pay | Admitting: Sports Medicine

## 2015-09-28 DIAGNOSIS — S39012A Strain of muscle, fascia and tendon of lower back, initial encounter: Secondary | ICD-10-CM

## 2016-01-15 MED FILL — DESOGESTR-ETH ESTRAD ETH ES: 0.15-0.02/0 | 84 days supply | Qty: 84 | Fill #0

## 2016-02-06 DIAGNOSIS — Z01419 Encounter for gynecological examination (general) (routine) without abnormal findings: Secondary | ICD-10-CM | POA: Diagnosis not present

## 2016-02-06 DIAGNOSIS — A630 Anogenital (venereal) warts: Secondary | ICD-10-CM | POA: Diagnosis not present

## 2016-02-06 DIAGNOSIS — Z1151 Encounter for screening for human papillomavirus (HPV): Secondary | ICD-10-CM | POA: Diagnosis not present

## 2016-02-20 ENCOUNTER — Ambulatory Visit (INDEPENDENT_AMBULATORY_CARE_PROVIDER_SITE_OTHER): Payer: 59 | Admitting: Physician Assistant

## 2016-02-20 ENCOUNTER — Encounter: Payer: Self-pay | Admitting: Physician Assistant

## 2016-02-20 VITALS — BP 121/59 | HR 67 | Ht 69.5 in

## 2016-02-20 DIAGNOSIS — L821 Other seborrheic keratosis: Secondary | ICD-10-CM | POA: Diagnosis not present

## 2016-02-20 DIAGNOSIS — Q828 Other specified congenital malformations of skin: Secondary | ICD-10-CM

## 2016-02-20 NOTE — Progress Notes (Signed)
   Subjective:    Patient ID: Brittany Mckenzie, female    DOB: 12-04-1976, 40 y.o.   MRN: [id number]  HPI Pt presents to the clinic with skin tags and seborrheic keratosis that need to be removed. They have become very irritated with shaving and when wearing a bra.    Review of Systems  All other systems reviewed and are negative.      Objective:   Physical Exam  Constitutional: She is oriented to person, place, and time. She appears well-developed and well-nourished.  HENT:  Head: Normocephalic and atraumatic.  Cardiovascular: Normal rate and regular rhythm.   Neurological: She is alert and oriented to person, place, and time.  Skin:     Psychiatric: She has a normal mood and affect. Her behavior is normal.          Assessment & Plan:  Skin tags- removed today.   Skin Tag Removal Procedure Note  Pre-operative Diagnosis: Classic skin tags (acrochordon)  Post-operative Diagnosis: Classic skin tags (acrochordon)  Locations: left and right axilla  Indications: irriration  Procedure Details  The risks (including bleeding and infection) and benefits of the procedure and Verbal informed consent obtained. Using sterile iris scissors, multiple skin tags were snipped off at their bases after cleansing with Betadine.  Bleeding was controlled by pressure.   Findings: Pathognomonic benign lesions  not sent for pathological exam.  Condition: Stable  Complications: none.  Plan: 1. Instructed to keep the wounds dry and covered for 24-48h and clean thereafter. 2. Warning signs of infection were reviewed.   3. Recommended that the patient use OTC acetaminophen as needed for pain.  4. Return as needed.   SK's- no charge. Cryotherapy done.  Cryotherapy Procedure Note  Pre-operative Diagnosis: SK's  Post-operative Diagnosis: same  Locations: multiple area of back  Indications: irritation  Procedure Details  History of allergy to iodine: no. Pacemaker?  no.  Patient informed of risks (permanent scarring, infection, light or dark discoloration, bleeding, infection, weakness, numbness and recurrence of the lesion) and benefits of the procedure and verbal informed consent obtained.  The areas are treated with liquid nitrogen therapy, frozen until ice ball extended 1-2 mm beyond lesion, allowed to thaw, and treated again. The patient tolerated procedure well.  The patient was instructed on post-op care, warned that there may be blister formation, redness and pain. Recommend OTC analgesia as needed for pain.  Condition: Stable  Complications: none.  Plan: 1. Instructed to keep the area dry and covered for 24-48h and clean thereafter. 2. Warning signs of infection were reviewed.   3. Recommended that the patient use OTC acetaminophen as needed for pain.

## 2016-04-18 MED FILL — DESOGESTR-ETH ESTRAD ETH ES: 0.15-0.02/0 | 84 days supply | Qty: 84 | Fill #0

## 2016-07-18 MED FILL — DESOGESTR-ETH ESTRAD ETH ES: 0.15-0.02/0 | 84 days supply | Qty: 84 | Fill #1

## 2016-08-27 DIAGNOSIS — H5203 Hypermetropia, bilateral: Secondary | ICD-10-CM | POA: Diagnosis not present

## 2016-10-06 MED FILL — VIORELE 28 DAY TABLET: 0.15-0.02/0 | 84 days supply | Qty: 84 | Fill #2

## 2016-12-22 MED FILL — VIORELE 28 DAY TABLET: 0.15-0.02/0 | 84 days supply | Qty: 84 | Fill #3

## 2017-01-09 ENCOUNTER — Other Ambulatory Visit: Payer: Self-pay

## 2017-01-09 ENCOUNTER — Other Ambulatory Visit: Payer: Self-pay | Admitting: Physician Assistant

## 2017-01-09 MED ORDER — OSELTAMIVIR PHOSPHATE 75 MG PO CAPS: 75.0000 mg | ORAL_CAPSULE | Freq: Every day | ORAL | 0 refills | Status: DC

## 2017-01-09 NOTE — Progress Notes (Signed)
Pt's husband had diagnosed flu type A. Sent preventative.

## 2017-02-11 ENCOUNTER — Telehealth: Payer: Self-pay | Admitting: *Deleted

## 2017-02-11 DIAGNOSIS — R238 Other skin changes: Secondary | ICD-10-CM

## 2017-02-11 NOTE — Telephone Encounter (Signed)
Pt presented with vesicle on L forearm. Viral culture obtained and sent to lab for testing.Audelia Hives [place]

## 2017-02-15 LAB — RFLXH. SIMPLEX/VZ VIRUS CULT/DIF

## 2017-02-18 LAB — VIRAL CULTURE VIRC: Source: 0

## 2017-02-26 ENCOUNTER — Ambulatory Visit (INDEPENDENT_AMBULATORY_CARE_PROVIDER_SITE_OTHER): Payer: 59 | Admitting: Sports Medicine

## 2017-02-26 DIAGNOSIS — M5416 Radiculopathy, lumbar region: Secondary | ICD-10-CM | POA: Insufficient documentation

## 2017-02-26 MED ORDER — PREDNISONE 50 MG PO TABS: ORAL_TABLET | ORAL | 0 refills | Status: DC

## 2017-02-26 MED ORDER — CYCLOBENZAPRINE HCL 10 MG PO TABS: ORAL_TABLET | ORAL | 0 refills | Status: DC

## 2017-02-26 MED ORDER — NAPROXEN SODIUM 220 MG PO CAPS
ORAL_CAPSULE | ORAL | Status: DC
Start: 2017-02-26 — End: 2022-05-02

## 2017-02-26 NOTE — Assessment & Plan Note (Signed)
Right S1 distribution.  Prednisone, Flexeril, patient will use over-the-counter Aleve. She will continue her home rehabilitation exercises, pain has been present for decades so we are going to go ahead and get x-rays as well.

## 2017-02-26 NOTE — Progress Notes (Signed)
   Subjective:    I'm seeing this patient as a consultation for:  Dr. 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 No bowel or bladder dysfunction, no saddle numbness, no constitutional symptoms. She has had a few sessions of formal physical therapy, generally does well with occasional Flexeril and Aleve. Back pain is starting to flare, and she would like to get this under control before it becomes more severe.  Past medical history:  Negative.  See flowsheet/record as well for more information.  Surgical history: Negative.  See flowsheet/record as well for more information.  Family history: Negative.  See flowsheet/record as well for more information.  Social history: Negative.  See flowsheet/record as well for more information.  Allergies, and medications have been entered into the medical record, reviewed, and no changes needed.   Review of Systems: No headache, visual changes, nausea, vomiting, diarrhea, constipation, dizziness, abdominal pain, skin rash, fevers, chills, night sweats, weight loss, swollen lymph nodes, body aches, joint swelling, muscle aches, chest pain, shortness of breath, mood changes, visual or auditory hallucinations.   Objective:   General: Well Developed, well nourished, and in no acute distress.  Neuro/Psych: Alert and oriented x3, extra-ocular muscles intact, able to move all 4 extremities, sensation grossly intact. Skin: Warm and dry, no rashes noted.  Respiratory: Not using accessory muscles, speaking in full sentences, trachea midline.  Cardiovascular: Pulses palpable, no extremity edema. Abdomen: Does not appear distended. Back Exam:  Inspection: Unremarkable  Motion: Flexion 45 deg, Extension 45 deg, Side  Bending to 45 deg bilaterally,  Rotation to 45 deg bilaterally  SLR laying: Positive with reproduction of right sided S1 radicular symptoms XSLR laying: Negative  Palpable tenderness: None. FABER: negative. Sensory change: Gross sensation intact to all lumbar and sacral dermatomes.  Reflexes: 2+ at both patellar tendons, 2+ at the left Achilles, 1+ at the right Achilles, Babinski's downgoing.  Strength at foot  Plantar-flexion: 5/5 Dorsi-flexion: 5/5 Eversion: 5/5 Inversion: 5/5  Leg strength  Quad: 5/5 Hamstring: 5/5 Hip flexor: 5/5 Hip abductors: 5/5  Gait unremarkable.  Impression and Recommendations:   This case required medical decision making of moderate complexity.  Right lumbar radiculopathy Right S1 distribution.  Prednisone, Flexeril, patient will use over-the-counter Aleve. She will continue her home rehabilitation exercises, pain has been present for decades so we are going to go ahead and get x-rays as well.

## 2017-03-23 ENCOUNTER — Telehealth: Payer: Self-pay | Admitting: Physician Assistant

## 2017-03-23 MED ORDER — DESOGESTREL-ETHINYL ESTRADIOL 0.15-0.02/0.01 MG (21/5) PO TABS: 1.0000 | ORAL_TABLET | ORAL | 1 refills | Status: DC

## 2017-03-23 NOTE — Telephone Encounter (Signed)
Pt does not have any refills on her OCP. Her GYN has not responded to request. She is asking for refills to get her until she can follow up with GYN. She is well controlled on OCP with no problems. Iran Planas PA-C

## 2017-04-16 MED FILL — VIORELE 28 DAY TABLET: 0.15-0.02/0 | 84 days supply | Qty: 84 | Fill #0

## 2017-05-20 DIAGNOSIS — Z6821 Body mass index (BMI) 21.0-21.9, adult: Secondary | ICD-10-CM | POA: Diagnosis not present

## 2017-05-20 DIAGNOSIS — B977 Papillomavirus as the cause of diseases classified elsewhere: Secondary | ICD-10-CM | POA: Diagnosis not present

## 2017-05-20 DIAGNOSIS — Z01419 Encounter for gynecological examination (general) (routine) without abnormal findings: Secondary | ICD-10-CM | POA: Diagnosis not present

## 2017-07-13 MED FILL — DESOGESTR-ETH ESTRAD ETH ES: 0.15-0.02/0 | 84 days supply | Qty: 84 | Fill #0

## 2017-07-17 ENCOUNTER — Ambulatory Visit: Payer: 59 | Admitting: Physician Assistant

## 2017-07-23 ENCOUNTER — Ambulatory Visit: Payer: 59 | Admitting: Physician Assistant

## 2017-09-02 DIAGNOSIS — H524 Presbyopia: Secondary | ICD-10-CM | POA: Diagnosis not present

## 2017-09-02 DIAGNOSIS — H52223 Regular astigmatism, bilateral: Secondary | ICD-10-CM | POA: Diagnosis not present

## 2017-09-02 DIAGNOSIS — H5203 Hypermetropia, bilateral: Secondary | ICD-10-CM | POA: Diagnosis not present

## 2017-09-23 MED FILL — PIMTREA 28 DAY TABLET: 0.15-0.02/0 | 84 days supply | Qty: 84 | Fill #1

## 2017-11-04 ENCOUNTER — Ambulatory Visit (INDEPENDENT_AMBULATORY_CARE_PROVIDER_SITE_OTHER): Payer: 59 | Admitting: Physician Assistant

## 2017-11-04 ENCOUNTER — Encounter: Payer: Self-pay | Admitting: Physician Assistant

## 2017-11-04 VITALS — BP 122/76 | Temp 98.3°F | Ht 69.0 in | Wt 147.0 lb

## 2017-11-04 DIAGNOSIS — L739 Follicular disorder, unspecified: Secondary | ICD-10-CM

## 2017-11-04 MED ORDER — DOXYCYCLINE HYCLATE 100 MG PO TABS: 100.0000 mg | ORAL_TABLET | Freq: Two times a day (BID) | ORAL | 0 refills | Status: DC

## 2017-11-04 MED FILL — DOXYCYCLINE HYCLATE 100 MG: 100 | 10 days supply | Qty: 20 | Fill #0

## 2017-11-04 NOTE — Progress Notes (Signed)
   Subjective:    Patient ID: Brittany Marry, MD, female    DOB: 02/12/1976, 41 y.o.   MRN: [id number]  HPI Pt is a 41 yo female who presents to the clinic with right labia swelling and tenderness since Monday, 3 days ago. She feels like she is becoming more tender and notice a swollen lymph node of the right inguinal area this morning. No fever, chills, dysuria, flank pain, abdominal pain, vaginal irritation, vaginal lesions or vaginal discharge. She has one sexual partner her husband. She does shave and has shaved recently.   . Active Ambulatory Problems    Diagnosis Date Noted  . Right lumbar radiculopathy 02/26/2017   Resolved Ambulatory Problems    Diagnosis Date Noted  . THROMBOCYTOPENIA 01/08/2011  . ALLERGIC RHINITIS, SEASONAL 01/08/2011  . GERD 01/08/2011  . TOE PAIN 01/15/2011  . Streptococcal pharyngitis 06/27/2015  . Accessory skin tags 02/20/2016   No Additional Past Medical History      Review of Systems  All other systems reviewed and are negative.      Objective:   Physical Exam  Constitutional: She appears well-developed and well-nourished.  HENT:  Head: Normocephalic and atraumatic.  Genitourinary: Vagina normal.     Psychiatric: She has a normal mood and affect. Her behavior is normal.          Assessment & Plan:  Marland KitchenMarland KitchenDiagnoses and all orders for this visit:  Folliculitis -     doxycycline (VIBRA-TABS) 100 MG tablet; Take 1 tablet (100 mg total) 2 (two) times daily by mouth. For 10 days.   Appears to be some folliculitis. Due to lymph node tender and enlarged will treat with abx. Encouraged warm compresses. Follow up as needed. Does not appear to need any drainage today.

## 2017-12-28 MED FILL — PIMTREA 28 DAY TABLET: 0.15-0.02/0 | 84 days supply | Qty: 84 | Fill #2

## 2018-01-25 ENCOUNTER — Ambulatory Visit (INDEPENDENT_AMBULATORY_CARE_PROVIDER_SITE_OTHER): Payer: 59 | Admitting: Sports Medicine

## 2018-01-25 VITALS — BP 124/63 | HR 84 | Temp 98.8°F

## 2018-01-25 DIAGNOSIS — N3000 Acute cystitis without hematuria: Secondary | ICD-10-CM | POA: Insufficient documentation

## 2018-01-25 DIAGNOSIS — R350 Frequency of micturition: Secondary | ICD-10-CM | POA: Diagnosis not present

## 2018-01-25 DIAGNOSIS — N3001 Acute cystitis with hematuria: Secondary | ICD-10-CM | POA: Diagnosis not present

## 2018-01-25 DIAGNOSIS — R3915 Urgency of urination: Secondary | ICD-10-CM | POA: Diagnosis not present

## 2018-01-25 LAB — POCT URINALYSIS DIPSTICK
Bilirubin, UA: NEGATIVE
Glucose, UA: NEGATIVE
Ketones, UA: NEGATIVE
Nitrite, UA: NEGATIVE
Protein, UA: NEGATIVE
Spec Grav, UA: 1.015 (ref 1.010–1.025)
Urobilinogen, UA: 0.2 E.U./dL
pH, UA: 7 (ref 5.0–8.0)

## 2018-01-25 MED ORDER — PHENAZOPYRIDINE HCL 200 MG PO TABS: 200.0000 mg | ORAL_TABLET | Freq: Three times a day (TID) | ORAL | 0 refills | Status: AC

## 2018-01-25 MED ORDER — NITROFURANTOIN MONOHYD MACRO 100 MG PO CAPS
100.0000 mg | ORAL_CAPSULE | Freq: Two times a day (BID) | ORAL | 0 refills | Status: DC
Start: 2018-01-25 — End: 2019-03-16

## 2018-01-25 NOTE — Progress Notes (Signed)
Patient advised.

## 2018-01-25 NOTE — Progress Notes (Signed)
   Subjective:    Patient ID: Hali Marry, MD, female    DOB: 09/21/1976, 42 y.o.   MRN: [id number]  HPI  Hali Marry, MD complains of frequent urination and urgency for 1 day. Patient reports no recent antibiotic use or no recent catheterization. Patient has not taken Azo. Denies flank pain, pelvic pain, fever, chills or sweats.   Review of Systems     Objective:   Physical Exam        Assessment & Plan:

## 2018-01-25 NOTE — Assessment & Plan Note (Signed)
Urinalysis with hematuria and pyuria. Adding urine culture, Macrobid for 7 days. Adding Azo. Due to the hematuria she should return in about 2 weeks for a repeat urinalysis to ensure clearance of hematuria.

## 2018-02-01 LAB — URINE CULTURE
MICRO NUMBER:: 90173365
SPECIMEN QUALITY:: ADEQUATE

## 2018-03-15 MED FILL — PIMTREA 28 DAY TABLET: 0.15-0.02/0 | 84 days supply | Qty: 84 | Fill #3

## 2018-06-09 DIAGNOSIS — Z01419 Encounter for gynecological examination (general) (routine) without abnormal findings: Secondary | ICD-10-CM | POA: Diagnosis not present

## 2018-06-09 DIAGNOSIS — Z6821 Body mass index (BMI) 21.0-21.9, adult: Secondary | ICD-10-CM | POA: Diagnosis not present

## 2018-06-09 DIAGNOSIS — N72 Inflammatory disease of cervix uteri: Secondary | ICD-10-CM | POA: Diagnosis not present

## 2018-06-09 DIAGNOSIS — B977 Papillomavirus as the cause of diseases classified elsewhere: Secondary | ICD-10-CM | POA: Diagnosis not present

## 2018-06-09 DIAGNOSIS — Z1151 Encounter for screening for human papillomavirus (HPV): Secondary | ICD-10-CM | POA: Diagnosis not present

## 2018-06-09 MED FILL — NORETHIND-ETH ESTRAD 1-0.02: 1-20 | 84 days supply | Qty: 84 | Fill #0

## 2018-07-18 ENCOUNTER — Other Ambulatory Visit: Payer: Self-pay | Admitting: Physician Assistant

## 2018-07-19 MED ORDER — DESOGESTREL-ETHINYL ESTRADIOL 0.15-0.02/0.01 MG (21/5) PO TABS: 1.0000 | ORAL_TABLET | ORAL | 0 refills | Status: DC

## 2018-09-20 ENCOUNTER — Telehealth: Payer: Self-pay | Admitting: Sports Medicine

## 2018-09-20 DIAGNOSIS — M84375A Stress fracture, left foot, initial encounter for fracture: Secondary | ICD-10-CM | POA: Insufficient documentation

## 2018-09-20 NOTE — Assessment & Plan Note (Signed)
1 month of left foot pain, medial over the navicular. Suspect navicular stress injury. Scaphoid pads placed in shoes, return for custom molded orthotics, rehab exercises given. If insufficient relief after 4 weeks we will proceed with MRI.

## 2018-09-20 NOTE — Telephone Encounter (Signed)
1 month of left foot pain, medial over the navicular. Suspect navicular stress injury. Scaphoid pads placed in shoes, return for custom molded orthotics, rehab exercises given. If insufficient relief after 4 weeks we will proceed with MRI.

## 2018-10-01 MED FILL — NORETHIND-ETH ESTRAD 1-0.02: 1-20 | 84 days supply | Qty: 84 | Fill #1

## 2018-12-03 ENCOUNTER — Telehealth: Payer: Self-pay | Admitting: Sports Medicine

## 2018-12-03 DIAGNOSIS — E041 Nontoxic single thyroid nodule: Secondary | ICD-10-CM | POA: Insufficient documentation

## 2018-12-03 NOTE — Telephone Encounter (Signed)
Incidentally noted left thyroid nodule >1cm on unofficial ultrasound during [place] scanning course.  Solid and concerning.  Ordering official neck ultrasound and suspect she will need a biopsy.  She could potentially have the scan over lunch on Monday 12/06/18.

## 2018-12-03 NOTE — Addendum Note (Signed)
Addended by: Silverio Decamp on: 12/03/2018 06:12 PM   Modules accepted: Orders

## 2018-12-03 NOTE — Assessment & Plan Note (Addendum)
Incidentally noted left thyroid nodule >1cm on unofficial ultrasound during [place] scanning course.  Solid and concerning.  Ordering official neck ultrasound and suspect she will need a biopsy.  She could potentially have the scan over lunch on Monday 12/06/18.  Official ultrasound confirms left thyroid nodule, biopsy ordered.  Patient notified this is a benign follicular nodule.   No further investigation needed.   She is doing well post-biopsy.

## 2018-12-06 ENCOUNTER — Ambulatory Visit (INDEPENDENT_AMBULATORY_CARE_PROVIDER_SITE_OTHER): Payer: 59

## 2018-12-06 ENCOUNTER — Other Ambulatory Visit: Payer: Self-pay

## 2018-12-06 DIAGNOSIS — E041 Nontoxic single thyroid nodule: Secondary | ICD-10-CM

## 2018-12-06 NOTE — Telephone Encounter (Addendum)
Nodule is large enough where she needs a biopsy, biopsy ordered. Please contact [facility] for scheduling

## 2018-12-06 NOTE — Addendum Note (Signed)
Addended by: Silverio Decamp on: 12/06/2018 01:50 PM   Modules accepted: Orders

## 2018-12-06 NOTE — Telephone Encounter (Signed)
Order sent to [place]. Pt aware.

## 2018-12-07 ENCOUNTER — Other Ambulatory Visit (HOSPITAL_COMMUNITY)
Admission: RE | Admit: 2018-12-07 | Discharge: 2018-12-07 | Disposition: A | Discharge status: Home / Self Care | Payer: 59 | Source: Ambulatory Visit | Attending: Interventional Radiology | Admitting: Interventional Radiology

## 2018-12-07 ENCOUNTER — Ambulatory Visit
Admission: RE | Admit: 2018-12-07 | Discharge: 2018-12-07 | Disposition: A | Discharge status: Home / Self Care | Payer: 59 | Source: Ambulatory Visit | Attending: Sports Medicine | Admitting: Sports Medicine

## 2018-12-07 DIAGNOSIS — E041 Nontoxic single thyroid nodule: Secondary | ICD-10-CM | POA: Insufficient documentation

## 2018-12-07 NOTE — Procedures (Signed)
PROCEDURE SUMMARY:  Using direct ultrasound guidance, 5 passes were made using 25 g needles into the nodule within the left lobe of the thyroid.   Ultrasound was used to confirm needle placements on all occasions.   EBL = trace  Specimens were sent to Pathology for analysis.  See procedure note under Imaging tab in Epic for full procedure details.  Hibba Schram S [place] PA-C 12/07/2018 1:34 PM

## 2018-12-29 ENCOUNTER — Other Ambulatory Visit: Payer: 59

## 2019-01-17 MED FILL — NORETHIND-ETH ESTRAD 1-0.02: 1-20 | 84 days supply | Qty: 84 | Fill #2

## 2019-03-16 ENCOUNTER — Ambulatory Visit (INDEPENDENT_AMBULATORY_CARE_PROVIDER_SITE_OTHER): Payer: 59 | Admitting: Physician Assistant

## 2019-03-16 ENCOUNTER — Other Ambulatory Visit: Payer: Self-pay

## 2019-03-16 DIAGNOSIS — L821 Other seborrheic keratosis: Secondary | ICD-10-CM | POA: Diagnosis not present

## 2019-03-21 ENCOUNTER — Encounter: Payer: Self-pay | Admitting: Physician Assistant

## 2019-03-21 DIAGNOSIS — L821 Other seborrheic keratosis: Secondary | ICD-10-CM | POA: Insufficient documentation

## 2019-03-21 NOTE — Progress Notes (Signed)
   Subjective:    Patient ID: Brittany Marry, MD, female    DOB: [birth year], 43 y.o.   MRN: [id number]  HPI Pt is a 43 yo female with fair skin who comes in for her annual SK removal. She has a hx of these and they are itchy and bother her. No other concerns today.   .. Active Ambulatory Problems    Diagnosis Date Noted  . Right lumbar radiculopathy 02/26/2017  . Acute cystitis 01/25/2018  . Stress fracture of navicular bone of left foot 09/20/2018  . Thyroid nodule 12/03/2018   Resolved Ambulatory Problems    Diagnosis Date Noted  . THROMBOCYTOPENIA 01/08/2011  . ALLERGIC RHINITIS, SEASONAL 01/08/2011  . GERD 01/08/2011  . TOE PAIN 01/15/2011  . Streptococcal pharyngitis 06/27/2015  . Accessory skin tags 02/20/2016   No Additional Past Medical History     Review of Systems See HPI>     Objective:   Physical Exam Vitals signs reviewed.  Skin:    Comments: Numerous Light brown raised wart like appearance slightly raised papules over upper and mid back, shoulders, and chest.   Neurological:     General: No focal deficit present.     Mental Status: She is alert.           Assessment & Plan:  Brittany Mckenzie KitchenMarland KitchenAvrey was seen today for follow-up.  Diagnoses and all orders for this visit:  Seborrheic keratoses   Cryotherapy Procedure Note  Pre-operative Diagnosis: SK's  Post-operative Diagnosis: SK's  Locations: upper and mid back and around neck/shoulders and chest.   Indications: irritated  Procedure Details  History of allergy to iodine: no. Pacemaker? no.  Patient informed of risks (permanent scarring, infection, light or dark discoloration, bleeding, infection, weakness, numbness and recurrence of the lesion) and benefits of the procedure and verbal informed consent obtained.  The areas are treated with liquid nitrogen therapy, frozen until ice ball extended 2 mm beyond lesion, allowed to thaw, and treated again. The patient tolerated procedure well.  The  patient was instructed on post-op care, warned that there may be blister formation, redness and pain. Recommend OTC analgesia as needed for pain.  Condition: Stable  Complications: none.  Plan: 1. Instructed to keep the area dry and covered for 24-48h and clean thereafter. 2. Warning signs of infection were reviewed.   3. Recommended that the patient use OTC acetaminophen as needed for pain.

## 2019-04-04 MED FILL — NORETHIND-ETH ESTRAD 1-0.02: 1-20 | 21 days supply | Qty: 21 | Fill #0

## 2019-04-18 ENCOUNTER — Telehealth: Payer: Self-pay | Admitting: Physician Assistant

## 2019-04-18 DIAGNOSIS — S20211A Contusion of right front wall of thorax, initial encounter: Secondary | ICD-10-CM

## 2019-04-18 DIAGNOSIS — S298XXA Other specified injuries of thorax, initial encounter: Secondary | ICD-10-CM

## 2019-04-18 NOTE — Telephone Encounter (Signed)
Pt slipped while cleaning tub and landed on right ribs on Saturday, 3 days ago. She has pain with movement and deep breathing. Aleve helps significantly. No bruising.   Will get xray to rule out fracture.   Iran Planas PA-C

## 2019-05-09 MED FILL — NORETHIND-ETH ESTRAD 1-0.02: 1-20 | 63 days supply | Qty: 63 | Fill #0

## 2019-05-17 ENCOUNTER — Ambulatory Visit: Payer: 59 | Admitting: Physician Assistant

## 2019-05-18 ENCOUNTER — Ambulatory Visit (INDEPENDENT_AMBULATORY_CARE_PROVIDER_SITE_OTHER): Payer: 59 | Admitting: Physician Assistant

## 2019-05-18 ENCOUNTER — Encounter: Payer: Self-pay | Admitting: Physician Assistant

## 2019-05-18 VITALS — BP 106/56 | HR 71 | Ht 69.0 in | Wt 147.0 lb

## 2019-05-18 DIAGNOSIS — H9191 Unspecified hearing loss, right ear: Secondary | ICD-10-CM | POA: Diagnosis not present

## 2019-05-18 NOTE — Progress Notes (Signed)
   Subjective:    Patient ID: Brittany Mckenzie, Brittany Mckenzie, female    DOB: 11-02-1976, 43 y.o.   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 For about 4 months she has had noticeable hearing loss. She struggles to use her cell phone over her right ear. She struggles to hear her CMA on the right side. 4 months ago she had some pain as well and started claritin which helped with pain and hearing but continues to have some issues with hearing. No pain today. No ear drainage.   .. Active Ambulatory Problems    Diagnosis Date Noted  . Right lumbar radiculopathy 02/26/2017  . Stress fracture of navicular bone of left foot 09/20/2018  . Thyroid nodule 12/03/2018  . 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 Hearing loss of right ear 05/18/2019   Resolved Ambulatory Problems    Diagnosis Date Noted  . THROMBOCYTOPENIA 01/08/2011  . ALLERGIC RHINITIS, SEASONAL 01/08/2011  . GERD 01/08/2011  . TOE PAIN 01/15/2011  . Streptococcal pharyngitis 06/27/2015  . Accessory skin tags 02/20/2016  . Acute cystitis 01/25/2018   No Additional Past Medical History      Review of Systems  All other systems reviewed and are negative.      Objective:   Physical Exam Vitals signs reviewed.  Constitutional:      Appearance: Normal appearance.  HENT:     Right Ear: Ear canal and external ear normal. There is no impacted cerumen.     Left Ear: Tympanic membrane, ear canal and external ear normal. There is no impacted cerumen.     Ears:     Comments: opague right TM from 9 oclock to 12 oclock.  Neurological:     Mental Status: She is alert.           Assessment & Plan:  Marland KitchenMarland KitchenNissa was seen today for hearing problem.  Diagnoses and all orders for this visit:  Hearing loss of right ear, unspecified hearing loss type  there is some mild hearing loss across the board and patient heard much clearer on the  left than compared to the right ear. She could not hear at 25 db but could hear at 40db across the board just much harder on the right.   I suspect there is still some fluid perhaps causing hearing loss. Suggest flonase daily and continue claritin. If still having problems continue medrol dose pak. If still having problems consider referral to ENT.

## 2019-08-03 DIAGNOSIS — Z01419 Encounter for gynecological examination (general) (routine) without abnormal findings: Secondary | ICD-10-CM | POA: Diagnosis not present

## 2019-08-03 DIAGNOSIS — N72 Inflammatory disease of cervix uteri: Secondary | ICD-10-CM | POA: Diagnosis not present

## 2019-08-03 DIAGNOSIS — B977 Papillomavirus as the cause of diseases classified elsewhere: Secondary | ICD-10-CM | POA: Diagnosis not present

## 2019-08-03 DIAGNOSIS — Z6821 Body mass index (BMI) 21.0-21.9, adult: Secondary | ICD-10-CM | POA: Diagnosis not present

## 2019-08-03 MED FILL — NORETHIND-ETH ESTRAD 1-0.02: 1-20 | 63 days supply | Qty: 63 | Fill #0

## 2019-11-01 MED FILL — NORETHIND-ETH ESTRAD 1-0.02: 1-20 | 63 days supply | Qty: 63 | Fill #1

## 2020-01-16 MED FILL — NORETHIND-ETH ESTRAD 1-0.02: 1-20 | 63 days supply | Qty: 63 | Fill #2

## 2020-02-03 ENCOUNTER — Other Ambulatory Visit: Payer: Self-pay | Admitting: Physician Assistant

## 2020-02-03 DIAGNOSIS — E041 Nontoxic single thyroid nodule: Secondary | ICD-10-CM

## 2020-03-16 ENCOUNTER — Telehealth: Payer: Self-pay | Admitting: Physician Assistant

## 2020-03-16 DIAGNOSIS — H903 Sensorineural hearing loss, bilateral: Secondary | ICD-10-CM | POA: Insufficient documentation

## 2020-03-16 DIAGNOSIS — H9193 Unspecified hearing loss, bilateral: Secondary | ICD-10-CM

## 2020-03-16 NOTE — Telephone Encounter (Signed)
Bilateral hearing loss for over a year. Right worse than left. Taking allergy medication that helps but not resolved.

## 2020-04-03 MED FILL — NORETHIND-ETH ESTRAD 1-0.02: 1-20 | 63 days supply | Qty: 63 | Fill #3

## 2020-04-06 DIAGNOSIS — H93293 Other abnormal auditory perceptions, bilateral: Secondary | ICD-10-CM | POA: Diagnosis not present

## 2020-06-15 DIAGNOSIS — H524 Presbyopia: Secondary | ICD-10-CM | POA: Diagnosis not present

## 2020-06-25 MED FILL — NORETHIND-ETH ESTRAD 1-0.02: 1-20 | 63 days supply | Qty: 63 | Fill #4

## 2020-08-31 ENCOUNTER — Other Ambulatory Visit (HOSPITAL_BASED_OUTPATIENT_CLINIC_OR_DEPARTMENT_OTHER): Payer: Self-pay | Admitting: Obstetrics and Gynecology

## 2020-08-31 DIAGNOSIS — Z131 Encounter for screening for diabetes mellitus: Secondary | ICD-10-CM | POA: Diagnosis not present

## 2020-08-31 DIAGNOSIS — Z130 Encounter for screening for diseases of the blood and blood-forming organs and certain disorders involving the immune mechanism: Secondary | ICD-10-CM | POA: Diagnosis not present

## 2020-08-31 DIAGNOSIS — Z1329 Encounter for screening for other suspected endocrine disorder: Secondary | ICD-10-CM | POA: Diagnosis not present

## 2020-08-31 DIAGNOSIS — Z6821 Body mass index (BMI) 21.0-21.9, adult: Secondary | ICD-10-CM | POA: Diagnosis not present

## 2020-08-31 DIAGNOSIS — Z0000 Encounter for general adult medical examination without abnormal findings: Secondary | ICD-10-CM | POA: Diagnosis not present

## 2020-08-31 DIAGNOSIS — Z13220 Encounter for screening for lipoid disorders: Secondary | ICD-10-CM | POA: Diagnosis not present

## 2020-08-31 DIAGNOSIS — Z01419 Encounter for gynecological examination (general) (routine) without abnormal findings: Secondary | ICD-10-CM | POA: Diagnosis not present

## 2020-08-31 MED FILL — JUNEL 1-20 TABLET: 1-20 | 84 days supply | Qty: 84 | Fill #0

## 2020-10-23 MED FILL — JUNEL 1-20 TABLET: 1-20 | 84 days supply | Qty: 84 | Fill #0

## 2021-01-08 ENCOUNTER — Other Ambulatory Visit: Payer: Self-pay

## 2021-01-08 ENCOUNTER — Ambulatory Visit (INDEPENDENT_AMBULATORY_CARE_PROVIDER_SITE_OTHER): Payer: 59 | Admitting: Physician Assistant

## 2021-01-08 DIAGNOSIS — H00025 Hordeolum internum left lower eyelid: Secondary | ICD-10-CM

## 2021-01-08 MED ORDER — ERYTHROMYCIN 5 MG/GM OP OINT: 1.0000 "application " | TOPICAL_OINTMENT | Freq: Four times a day (QID) | OPHTHALMIC | 0 refills | Status: AC

## 2021-01-09 ENCOUNTER — Encounter: Payer: Self-pay | Admitting: Physician Assistant

## 2021-01-09 DIAGNOSIS — H00025 Hordeolum internum left lower eyelid: Secondary | ICD-10-CM | POA: Insufficient documentation

## 2021-01-09 NOTE — Progress Notes (Signed)
   Subjective:    Patient ID: Hali Marry, MD, female    DOB: 03-10-1976, 45 y.o.   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 Noticed some irritation for last 2 days but the bump today. She does have some eye watering but no eye discharge. She does not wear contacts. No eye injury. No vision changes.    Review of Systems See HPI>     Objective:   Physical Exam Left conjunctiva slightly erythematous along with lower left eye lid. Small nodule noticed in lower eyelid. No outer eyelid abnormalities. No eye discharge.        Assessment & Plan:  Marland KitchenMarland KitchenDiagnoses and all orders for this visit:  Hordeolum internum of left lower eyelid -     erythromycin ophthalmic ointment; Place 1 application into the left eye 4 (four) times daily for 5 days.   Given emycin ointment. Encouraged warm compresses. Follow up as needed.

## 2021-02-01 MED FILL — NORETHIND-ETH ESTRAD 1-0.02: 1-20 | 84 days supply | Qty: 84 | Fill #1

## 2021-05-24 ENCOUNTER — Other Ambulatory Visit (HOSPITAL_BASED_OUTPATIENT_CLINIC_OR_DEPARTMENT_OTHER): Payer: Self-pay

## 2021-05-24 MED FILL — Norethindrone Ace & Ethinyl Estradiol Tab 1 MG-20 MCG: ORAL | 84 days supply | Qty: 84 | Fill #0 | Status: AC

## 2021-09-08 ENCOUNTER — Other Ambulatory Visit: Payer: Self-pay

## 2021-09-09 ENCOUNTER — Other Ambulatory Visit (HOSPITAL_BASED_OUTPATIENT_CLINIC_OR_DEPARTMENT_OTHER): Payer: Self-pay

## 2021-09-09 MED ORDER — NORETHINDRONE ACET-ETHINYL EST 1-20 MG-MCG PO TABS
1.0000 | ORAL_TABLET | Freq: Every day | ORAL | 0 refills | Status: DC
  Filled 2021-09-09: qty 21, 21d supply, fill #0

## 2021-09-25 ENCOUNTER — Ambulatory Visit (INDEPENDENT_AMBULATORY_CARE_PROVIDER_SITE_OTHER): Payer: 59 | Admitting: Physician Assistant

## 2021-09-25 ENCOUNTER — Other Ambulatory Visit (HOSPITAL_BASED_OUTPATIENT_CLINIC_OR_DEPARTMENT_OTHER): Payer: Self-pay

## 2021-09-25 DIAGNOSIS — B351 Tinea unguium: Secondary | ICD-10-CM | POA: Insufficient documentation

## 2021-09-25 MED ORDER — TERBINAFINE HCL 250 MG PO TABS
250.0000 mg | ORAL_TABLET | Freq: Every day | ORAL | 1 refills | Status: AC
  Filled 2021-09-25: qty 90, 90d supply, fill #0

## 2021-09-25 NOTE — Progress Notes (Signed)
   Subjective:    Patient ID: Brittany Marry, MD, female    DOB: 23-Feb-1976, 45 y.o.   MRN: [id number]  HPI Pt presents to the clinic with right great toenail that is yellow and thick at the base with some discoloration. Hx of toe nail fungus.   .. 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     Objective:   Physical Exam  Right great toenail is yellow and thick at the base with some dark discoloration at the base of the toe.       Assessment & Plan:  Marland KitchenMarland KitchenDiagnoses and all orders for this visit:  Onychomycosis -     terbinafine (LAMISIL) 250 MG tablet; Take 1 tablet (250 mg total) by mouth daily.  Discussed fungus and prevention. Sent lamisil for 6 months.

## 2021-10-09 ENCOUNTER — Other Ambulatory Visit (HOSPITAL_BASED_OUTPATIENT_CLINIC_OR_DEPARTMENT_OTHER): Payer: Self-pay

## 2021-10-09 MED ORDER — NORETHINDRONE ACET-ETHINYL EST 1-20 MG-MCG PO TABS
ORAL_TABLET | ORAL | 2 refills | Status: DC
  Filled 2021-10-09: qty 21, 21d supply, fill #0
  Filled 2021-11-06: qty 21, 21d supply, fill #1
  Filled 2021-12-18: qty 21, 21d supply, fill #2

## 2021-11-07 ENCOUNTER — Other Ambulatory Visit (HOSPITAL_BASED_OUTPATIENT_CLINIC_OR_DEPARTMENT_OTHER): Payer: Self-pay

## 2021-12-11 ENCOUNTER — Other Ambulatory Visit (HOSPITAL_COMMUNITY): Payer: Self-pay

## 2021-12-11 DIAGNOSIS — Z113 Encounter for screening for infections with a predominantly sexual mode of transmission: Secondary | ICD-10-CM | POA: Diagnosis not present

## 2021-12-11 DIAGNOSIS — Z1211 Encounter for screening for malignant neoplasm of colon: Secondary | ICD-10-CM | POA: Diagnosis not present

## 2021-12-11 DIAGNOSIS — Z1212 Encounter for screening for malignant neoplasm of rectum: Secondary | ICD-10-CM | POA: Diagnosis not present

## 2021-12-11 DIAGNOSIS — Z01411 Encounter for gynecological examination (general) (routine) with abnormal findings: Secondary | ICD-10-CM | POA: Diagnosis not present

## 2021-12-11 DIAGNOSIS — Z01419 Encounter for gynecological examination (general) (routine) without abnormal findings: Secondary | ICD-10-CM | POA: Diagnosis not present

## 2021-12-11 DIAGNOSIS — Z124 Encounter for screening for malignant neoplasm of cervix: Secondary | ICD-10-CM | POA: Diagnosis not present

## 2021-12-11 DIAGNOSIS — R87810 Cervical high risk human papillomavirus (HPV) DNA test positive: Secondary | ICD-10-CM | POA: Diagnosis not present

## 2021-12-11 DIAGNOSIS — Z6821 Body mass index (BMI) 21.0-21.9, adult: Secondary | ICD-10-CM | POA: Diagnosis not present

## 2021-12-11 DIAGNOSIS — Z3041 Encounter for surveillance of contraceptive pills: Secondary | ICD-10-CM | POA: Diagnosis not present

## 2021-12-11 MED ORDER — NORETHIN ACE-ETH ESTRAD-FE 1-20 MG-MCG(24) PO CAPS
ORAL_CAPSULE | ORAL | 3 refills | Status: DC
  Filled 2021-12-11 – 2021-12-25 (×2): qty 84, 84d supply, fill #0
  Filled 2022-04-08: qty 84, 84d supply, fill #1
  Filled 2022-06-28 – 2022-06-30 (×2): qty 84, 84d supply, fill #2
  Filled 2022-09-20: qty 84, 84d supply, fill #3

## 2021-12-12 ENCOUNTER — Other Ambulatory Visit (HOSPITAL_COMMUNITY): Payer: Self-pay

## 2021-12-17 ENCOUNTER — Other Ambulatory Visit (HOSPITAL_COMMUNITY): Payer: Self-pay

## 2021-12-18 ENCOUNTER — Other Ambulatory Visit (HOSPITAL_COMMUNITY): Payer: Self-pay

## 2021-12-18 ENCOUNTER — Other Ambulatory Visit (HOSPITAL_BASED_OUTPATIENT_CLINIC_OR_DEPARTMENT_OTHER): Payer: Self-pay

## 2021-12-25 ENCOUNTER — Other Ambulatory Visit: Payer: Self-pay | Admitting: Obstetrics and Gynecology

## 2021-12-25 ENCOUNTER — Other Ambulatory Visit (HOSPITAL_COMMUNITY): Payer: Self-pay

## 2021-12-25 DIAGNOSIS — Z1231 Encounter for screening mammogram for malignant neoplasm of breast: Secondary | ICD-10-CM

## 2021-12-26 ENCOUNTER — Other Ambulatory Visit (HOSPITAL_COMMUNITY): Payer: Self-pay

## 2022-01-01 ENCOUNTER — Other Ambulatory Visit: Payer: Self-pay

## 2022-01-01 ENCOUNTER — Ambulatory Visit (INDEPENDENT_AMBULATORY_CARE_PROVIDER_SITE_OTHER): Payer: 59

## 2022-01-01 DIAGNOSIS — Z1231 Encounter for screening mammogram for malignant neoplasm of breast: Secondary | ICD-10-CM | POA: Diagnosis not present

## 2022-01-03 ENCOUNTER — Other Ambulatory Visit: Payer: Self-pay | Admitting: Obstetrics and Gynecology

## 2022-01-03 DIAGNOSIS — R928 Other abnormal and inconclusive findings on diagnostic imaging of breast: Secondary | ICD-10-CM

## 2022-01-08 DIAGNOSIS — Z1211 Encounter for screening for malignant neoplasm of colon: Secondary | ICD-10-CM | POA: Diagnosis not present

## 2022-01-17 DIAGNOSIS — R87619 Unspecified abnormal cytological findings in specimens from cervix uteri: Secondary | ICD-10-CM | POA: Diagnosis not present

## 2022-01-24 ENCOUNTER — Ambulatory Visit
Admission: RE | Admit: 2022-01-24 | Discharge: 2022-01-24 | Disposition: A | Discharge status: Home / Self Care | Payer: 59 | Source: Ambulatory Visit | Attending: Obstetrics and Gynecology | Admitting: Obstetrics and Gynecology

## 2022-01-24 ENCOUNTER — Other Ambulatory Visit: Payer: Self-pay | Admitting: Obstetrics and Gynecology

## 2022-01-24 DIAGNOSIS — R928 Other abnormal and inconclusive findings on diagnostic imaging of breast: Secondary | ICD-10-CM

## 2022-01-24 DIAGNOSIS — N6489 Other specified disorders of breast: Secondary | ICD-10-CM | POA: Diagnosis not present

## 2022-01-24 DIAGNOSIS — R599 Enlarged lymph nodes, unspecified: Secondary | ICD-10-CM

## 2022-01-24 DIAGNOSIS — R922 Inconclusive mammogram: Secondary | ICD-10-CM | POA: Diagnosis not present

## 2022-03-14 ENCOUNTER — Encounter: Payer: Self-pay | Admitting: Internal Medicine

## 2022-04-08 ENCOUNTER — Other Ambulatory Visit (HOSPITAL_COMMUNITY): Payer: Self-pay

## 2022-04-10 ENCOUNTER — Other Ambulatory Visit (HOSPITAL_COMMUNITY): Payer: Self-pay

## 2022-05-02 ENCOUNTER — Other Ambulatory Visit (HOSPITAL_BASED_OUTPATIENT_CLINIC_OR_DEPARTMENT_OTHER): Payer: Self-pay

## 2022-05-02 ENCOUNTER — Ambulatory Visit (AMBULATORY_SURGERY_CENTER): Payer: 59 | Admitting: *Deleted

## 2022-05-02 VITALS — Ht 69.0 in | Wt 150.0 lb

## 2022-05-02 DIAGNOSIS — R195 Other fecal abnormalities: Secondary | ICD-10-CM

## 2022-05-02 MED ORDER — NA SULFATE-K SULFATE-MG SULF 17.5-3.13-1.6 GM/177ML PO SOLN
1.0000 | ORAL | 0 refills | Status: DC
  Filled 2022-05-02: qty 354, 1d supply, fill #0

## 2022-05-02 NOTE — Progress Notes (Signed)
Patient's pre-visit was done today over the phone with the patient. Name,DOB and address verified. Patient denies any allergies to Eggs and Soy. Patient denies any problems with anesthesia/sedation. Patient is not taking any diet pills or blood thinners. No home Oxygen. Insurance confirmed with patient. ? ?Prep instructions sent to pt's MyChart-pt is aware. Patient understands to call us back with any questions or concerns. Patient is aware of our care-partner policy.  ? ?EMMI education assigned to the patient for the procedure, sent to [facility].  ? ?The patient is COVID-19 vaccinated.   ?

## 2022-05-21 ENCOUNTER — Encounter: Payer: Self-pay | Admitting: Internal Medicine

## 2022-05-23 ENCOUNTER — Ambulatory Visit (AMBULATORY_SURGERY_CENTER): Payer: 59 | Admitting: Internal Medicine

## 2022-05-23 ENCOUNTER — Encounter: Payer: Self-pay | Admitting: Internal Medicine

## 2022-05-23 VITALS — BP 113/70 | HR 79 | Temp 98.7°F | Resp 14 | Ht 69.0 in | Wt 150.0 lb

## 2022-05-23 DIAGNOSIS — D128 Benign neoplasm of rectum: Secondary | ICD-10-CM

## 2022-05-23 DIAGNOSIS — R195 Other fecal abnormalities: Secondary | ICD-10-CM | POA: Diagnosis not present

## 2022-05-23 DIAGNOSIS — K621 Rectal polyp: Secondary | ICD-10-CM | POA: Diagnosis not present

## 2022-05-23 MED ORDER — SODIUM CHLORIDE 0.9 % IV SOLN: 500.0000 mL | Freq: Once | INTRAVENOUS | Status: DC

## 2022-05-23 NOTE — Progress Notes (Signed)
Called to room to assist during endoscopic procedure.  Patient ID and intended procedure confirmed with present staff. Received instructions for my participation in the procedure from the performing physician.  

## 2022-05-23 NOTE — Progress Notes (Signed)
Pt's states no medical or surgical changes since previsit or office visit. VS assessed by D.T 

## 2022-05-23 NOTE — Op Note (Signed)
[name]: Brittany Mckenzie Procedure Date: 05/23/2022 10:10 AM MRN: [id number] Endoscopist: Gatha Mayer , MD Age: 46 Referring MD:  Date of Birth: Aug 20, 1976 Gender: Female Account #: [id number] Procedure:                Colonoscopy Indications:              Positive Cologuard test Medicines:                Monitored Anesthesia Care Procedure:                Pre-Anesthesia Assessment:                           - Prior to the procedure, a History and Physical                            was performed, and patient medications and                            allergies were reviewed. The patient's tolerance of                            previous anesthesia was also reviewed. The risks                            and benefits of the procedure and the sedation                            options and risks were discussed with the patient.                            All questions were answered, and informed consent                            was obtained. Prior Anticoagulants: The patient has                            taken no previous anticoagulant or antiplatelet                            agents. ASA Grade Assessment: I - A normal, healthy                            patient. After reviewing the risks and benefits,                            the patient was deemed in satisfactory condition to                            undergo the procedure.                           After obtaining informed consent, the colonoscope  was passed under direct vision. Throughout the                            procedure, the patient's blood pressure, pulse, and                            oxygen saturations were monitored continuously. The                            PCF-HQ190L Colonoscope was introduced through the                            anus and advanced to the the cecum, identified by                            appendiceal orifice and ileocecal valve. The                             colonoscopy was performed without difficulty. The                            patient tolerated the procedure well. The quality                            of the bowel preparation was good. The ileocecal                            valve, appendiceal orifice, and rectum were                            photographed. The bowel preparation used was SUPREP                            via split dose instruction. Scope In: 10:20:38 AM Scope Out: 10:38:20 AM Scope Withdrawal Time: 0 hours 11 minutes 28 seconds  Total Procedure Duration: 0 hours 17 minutes 42 seconds  Findings:                 The perianal and digital rectal examinations were                            normal.                           A 2 mm polyp was found in the rectum. The polyp was                            sessile. The polyp was removed with a cold snare.                            Resection and retrieval were complete. Verification                            of patient identification for the specimen was  done. Estimated blood loss was minimal.                           External hemorrhoids were found.                           The exam was otherwise without abnormality on                            direct and retroflexion views. Complications:            No immediate complications. Estimated Blood Loss:     Estimated blood loss was minimal. Impression:               - One 2 mm polyp in the rectum, removed with a cold                            snare. Resected and retrieved.                           - External hemorrhoids.                           - The examination was otherwise normal on direct                            and retroflexion views. Recommendation:           - Patient has a contact number available for                            emergencies. The signs and symptoms of potential                            delayed complications were discussed with the                             patient. Return to normal activities tomorrow.                            Written discharge instructions were provided to the                            patient.                           - Resume previous diet.                           - Continue present medications.                           - Await pathology results.                           - Repeat colonoscopy is recommended. The  colonoscopy date will be determined after pathology                            results from today's exam become available for                            review. Gatha Mayer, MD 05/23/2022 10:47:47 AM This report has been signed electronically.

## 2022-05-23 NOTE — Progress Notes (Signed)
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 Authorizing Provider  famotidine (PEPCID) 10 MG tablet Take 10 mg by mouth 2 (two) times daily.   Yes [provider]  multivitamin [facility]) per tablet Take 1 tablet by mouth daily.     Yes [provider]  Norethin Ace-Eth Estrad-FE (TAYTULLA) 1-20 MG-MCG(24) CAPS Take 1 capsule by mouth daily. 12/11/21  Yes     Current Outpatient Medications  Medication Sig Dispense Refill   famotidine (PEPCID) 10 MG tablet Take 10 mg by mouth 2 (two) times daily.     multivitamin (THERAGRAN) per tablet Take 1 tablet by mouth daily.       Norethin Ace-Eth Estrad-FE (TAYTULLA) 1-20 MG-MCG(24) CAPS Take 1 capsule by mouth daily. 84 capsule 3   Current Facility-Administered Medications  Medication Dose Route Frequency Provider Last Rate Last Admin   0.9 %  sodium chloride infusion  500 mL Intravenous Once Brittany Mayer, MD        Allergies as of 05/23/2022   (No Known Allergies)    Family History  Problem Relation Age of Onset   Coronary artery disease Mother        stent at 44   Arthritis Mother 47       rheumatoid    Migraines Mother    Thrombocytopenia Mother    Edema Mother        pulmonary   Lung disease Mother        Possible lymphoma   Hypertension Mother    GER disease Mother        reflux   Parkinsonism Father        diagnosed roughly 5-10  yrs ago   Migraines Sister    GER disease Sister        severe   Coronary artery disease Maternal Aunt        First stents in 60's   Heart attack Maternal Grandmother        massive   GER disease Maternal Grandfather        severe   Cancer Maternal Grandfather        esophageal   Coronary artery disease Maternal Grandfather    Diabetes Maternal Grandfather        well controlled   Hypertension Maternal Grandfather    Diabetes Paternal Grandfather    Colon cancer Neg Hx     Social History   Socioeconomic History   Marital status: Married    Spouse name: Not on file   Number of children: Not on file   Years of education: Not on file   Highest education level: Not on file  Occupational History   Not on file  Tobacco Use   Smoking status: Never   Smokeless tobacco: Never  Vaping Use   Vaping  Use: Never used  Substance and Sexual Activity   Alcohol use: Yes    Comment: occ once a month maybe per pt   Drug use: No   Sexual activity: Not on file  Other Topics Concern   Not on file  Social History Narrative   Not on file   Social Determinants of Health   Financial Resource Strain: Not on file  Food Insecurity: Not on file  Transportation Needs: Not on file  Physical Activity: Not on file  Stress: Not on file  Social Connections: Not on file  Intimate Partner Violence: Not on file    Review of Systems:  All other review of systems negative except as mentioned in the HPI.  Physical Exam: Vital signs BP 121/71   Pulse 92   Temp 98.7 F (37.1 C) (Skin)   Ht 5' 9"  (1.753 m)   Wt 150 lb (68 kg)   SpO2 98%   BMI 22.15 kg/m   General:   Alert,  Well-developed, well-nourished, pleasant and cooperative in NAD Lungs:  Clear throughout to auscultation.   Heart:  Regular rate and rhythm; no murmurs, clicks, rubs,  or gallops. Abdomen:  Soft, nontender and nondistended. Normal bowel sounds.   Neuro/Psych:  Alert and cooperative. Normal mood and affect. A and O x  3   @Brittany Mckenzie  Simonne Maffucci, MD, [facility] [phone] (pager) 05/23/2022 10:11 AM@

## 2022-05-23 NOTE — Progress Notes (Signed)
VSS, transported to PACU °

## 2022-05-23 NOTE — Patient Instructions (Addendum)
There was a 2 mm rectal polyp removed and the external hemorrhoids in anal canal were a bit prominent. The prep often does that.  I will let you know pathology results and when to have another routine colonoscopy by mail and/or My Chart.   I appreciate the opportunity to care for you. Gatha Mayer, MD, [facility]    Thank you for letting us take care of your healthcare needs today. Please see handouts given to you on Polyps and Hemorrhoids.   YOU HAD AN ENDOSCOPIC PROCEDURE TODAY AT [facility]:   Refer to the procedure report that was given to you for any specific questions about what was found during the examination.  If the procedure report does not answer your questions, please call your gastroenterologist to clarify.  If you requested that your care partner not be given the details of your procedure findings, then the procedure report has been included in a sealed envelope for you to review at your convenience later.  YOU SHOULD EXPECT: Some feelings of bloating in the abdomen. Passage of more gas than usual.  Walking can help get rid of the air that was put into your GI tract during the procedure and reduce the bloating. If you had a lower endoscopy (such as a colonoscopy or flexible sigmoidoscopy) you may notice spotting of blood in your stool or on the toilet paper. If you underwent a bowel prep for your procedure, you may not have a normal bowel movement for a few days.  Please Note:  You might notice some irritation and congestion in your nose or some drainage.  This is from the oxygen used during your procedure.  There is no need for concern and it should clear up in a day or so.  SYMPTOMS TO REPORT IMMEDIATELY:  Following lower endoscopy (colonoscopy or flexible sigmoidoscopy):  Excessive amounts of blood in the stool  Significant tenderness or worsening of abdominal pains  Swelling of the abdomen that is new, acute  Fever of 100F or higher  For urgent or  emergent issues, a gastroenterologist can be reached at any hour by calling [phone]. Do not use MyChart messaging for urgent concerns.    DIET:  We do recommend a small meal at first, but then you may proceed to your regular diet.  Drink plenty of fluids but you should avoid alcoholic beverages for 24 hours.  ACTIVITY:  You should plan to take it easy for the rest of today and you should NOT DRIVE or use heavy machinery until tomorrow (because of the sedation medicines used during the test).    FOLLOW UP: Our staff will call the number listed on your records 48-72 hours following your procedure to check on you and address any questions or concerns that you may have regarding the information given to you following your procedure. If we do not reach you, we will leave a message.  We will attempt to reach you two times.  During this call, we will ask if you have developed any symptoms of COVID 19. If you develop any symptoms (ie: fever, flu-like symptoms, shortness of breath, cough etc.) before then, please call [phone].  If you test positive for Covid 19 in the 2 weeks post procedure, please call and report this information to [place].    If any biopsies were taken you will be contacted by phone or by letter within the next 1-3 weeks.  Please call us at [phone] if you have not heard  about the biopsies in 3 weeks.    SIGNATURES/CONFIDENTIALITY: You and/or your care partner have signed paperwork which will be entered into your electronic medical record.  These signatures attest to the fact that that the information above on your After Visit Summary has been reviewed and is understood.  Full responsibility of the confidentiality of this discharge information lies with you and/or your care-partner.

## 2022-05-26 ENCOUNTER — Telehealth: Payer: Self-pay

## 2022-05-26 ENCOUNTER — Telehealth: Payer: Self-pay | Admitting: *Deleted

## 2022-05-26 NOTE — Telephone Encounter (Signed)
First post procedure follow up call, no answer 

## 2022-05-26 NOTE — Telephone Encounter (Signed)
  Follow up Call-     05/23/2022    9:40 AM  Call back number  Post procedure Call Back phone  # [phone]  Permission to leave phone message Yes     Patient questions:  Do you have a fever, pain , or abdominal swelling? No. Pain Score  0 *  Have you tolerated food without any problems? Yes.    Have you been able to return to your normal activities? Yes.    Do you have any questions about your discharge instructions: Diet   No. Medications  No. Follow up visit  No.  Do you have questions or concerns about your Care? No.  Actions: * If pain score is 4 or above: No action needed, pain <4.

## 2022-05-27 ENCOUNTER — Encounter: Payer: Self-pay | Admitting: Internal Medicine

## 2022-06-30 ENCOUNTER — Other Ambulatory Visit (HOSPITAL_COMMUNITY): Payer: Self-pay

## 2022-07-01 ENCOUNTER — Other Ambulatory Visit (HOSPITAL_COMMUNITY): Payer: Self-pay

## 2022-07-25 ENCOUNTER — Other Ambulatory Visit: Payer: 59

## 2022-08-08 ENCOUNTER — Ambulatory Visit
Admission: RE | Admit: 2022-08-08 | Discharge: 2022-08-08 | Disposition: A | Discharge status: Home / Self Care | Payer: 59 | Source: Ambulatory Visit | Attending: Obstetrics and Gynecology | Admitting: Obstetrics and Gynecology

## 2022-08-08 ENCOUNTER — Other Ambulatory Visit: Payer: Self-pay | Admitting: Obstetrics and Gynecology

## 2022-08-08 DIAGNOSIS — R599 Enlarged lymph nodes, unspecified: Secondary | ICD-10-CM

## 2022-08-08 DIAGNOSIS — N6321 Unspecified lump in the left breast, upper outer quadrant: Secondary | ICD-10-CM | POA: Diagnosis not present

## 2022-09-20 ENCOUNTER — Other Ambulatory Visit (HOSPITAL_COMMUNITY): Payer: Self-pay

## 2022-09-22 ENCOUNTER — Other Ambulatory Visit (HOSPITAL_COMMUNITY): Payer: Self-pay

## 2022-09-23 ENCOUNTER — Other Ambulatory Visit (HOSPITAL_COMMUNITY): Payer: Self-pay

## 2022-10-10 ENCOUNTER — Ambulatory Visit: Payer: 59 | Admitting: Podiatrist

## 2022-10-10 ENCOUNTER — Encounter: Payer: Self-pay | Admitting: Podiatrist

## 2022-10-10 DIAGNOSIS — L601 Onycholysis: Secondary | ICD-10-CM | POA: Diagnosis not present

## 2022-10-10 DIAGNOSIS — L603 Nail dystrophy: Secondary | ICD-10-CM | POA: Diagnosis not present

## 2022-10-10 DIAGNOSIS — B351 Tinea unguium: Secondary | ICD-10-CM | POA: Diagnosis not present

## 2022-10-10 NOTE — Progress Notes (Unsigned)
  Chief Complaint  Patient presents with   Nail Problem    Bil nail fungus      HPI: Patient is 46 y.o. female who presents today for thickening and discoloration of the right great toenail.  She relates she had a pedicure about a year ago and after the nail polish came off she noticed the discoloration of the nail.  She relates the left great toenail is also discolored but not as significant as the right.  Right great toenail is also loose and it appears a new nail is pushing out the old nail.  She has never had the nail cultured.  Patient Active Problem List   Diagnosis Date Noted   Onychomycosis 09/25/2021   Hordeolum internum of left lower eyelid 01/09/2021   Sensorineural hearing loss (SNHL) of both ears 03/16/2020   Hearing loss of right ear 05/18/2019   Seborrheic keratoses 03/21/2019   Thyroid nodule 12/03/2018   Stress fracture of navicular bone of left foot 09/20/2018   Right lumbar radiculopathy 02/26/2017    Current Outpatient Medications on File Prior to Visit  Medication Sig Dispense Refill   famotidine (PEPCID) 10 MG tablet Take 10 mg by mouth 2 (two) times daily.     multivitamin (THERAGRAN) per tablet Take 1 tablet by mouth daily.       Norethin Ace-Eth Estrad-FE (TAYTULLA) 1-20 MG-MCG(24) CAPS Take 1 capsule by mouth daily. 84 capsule 3   No current facility-administered medications on file prior to visit.    No Known Allergies  Review of Systems No fevers, chills, nausea, muscle aches, no difficulty breathing, no calf pain, no chest pain or shortness of breath.   Physical Exam  GENERAL APPEARANCE: Alert, conversant. Appropriately groomed. No acute distress.   VASCULAR: Pedal pulses palpable 2/4 DP and  PT bilateral.  Capillary refill time is immediate to all digits,  Proximal to distal cooling is warm to warm.  Digital perfusion adequate.   NEUROLOGIC: sensation is intact to 5.07 monofilament at 5/5 sites bilateral.  Light touch is intact bilateral,  vibratory sensation intact bilateral  MUSCULOSKELETAL: acceptable muscle strength, tone and stability bilateral.  No gross boney pedal deformities noted.  No pain, crepitus or limitation noted with foot and ankle range of motion bilateral.   DERMATOLOGIC: skin is warm, supple, and dry.  Color, texture, and turgor of skin within normal limits.  Right hallux nail is thickened, discolored, dystrophic there is some purplish discoloration of the proximal portion of the nail which is likely dried blood.  Left hallux nail is also yellowish in discoloration and thickened as well.   Assessment     ICD-10-CM   1. Nail dystrophy  L60.3        Plan  Discussed exam findings and treatment recommendations.  At today's visit I recommended taking a sample of the right great toenail and sending off for culture and organism identification.  I will call her with the result of the culture and we can decide on treatment at that point.  She has tried Lamisil in the past briefly but had a side effect and stopped taking it.  We will consider oral options should the culture be positive.

## 2022-10-21 ENCOUNTER — Telehealth: Payer: Self-pay | Admitting: *Deleted

## 2022-10-21 NOTE — Telephone Encounter (Signed)
Patient is calling for culture lab results from 10/10/22,not seeing anything  in epic from that date. [facility] and they did not receive any culture on this patient, was it sent to [facility]? Please advise /recollect sample.

## 2022-11-05 ENCOUNTER — Other Ambulatory Visit (HOSPITAL_BASED_OUTPATIENT_CLINIC_OR_DEPARTMENT_OTHER): Payer: Self-pay

## 2022-11-05 ENCOUNTER — Other Ambulatory Visit: Payer: Self-pay | Admitting: Podiatrist

## 2022-11-05 MED ORDER — TERBINAFINE HCL 250 MG PO TABS
500.0000 mg | ORAL_TABLET | Freq: Every day | ORAL | 0 refills | Status: DC
  Filled 2022-11-05: qty 56, 84d supply, fill #0

## 2022-11-05 NOTE — Progress Notes (Signed)
Toenail culture positive for fungus.  Discussed findings with Brittany Mckenzie-  will do a lamisil pulse dosing of 500 daily x 7 days off 21.  X 3-4 months

## 2022-11-12 ENCOUNTER — Other Ambulatory Visit (HOSPITAL_BASED_OUTPATIENT_CLINIC_OR_DEPARTMENT_OTHER): Payer: Self-pay

## 2022-12-12 ENCOUNTER — Other Ambulatory Visit (HOSPITAL_COMMUNITY): Payer: Self-pay

## 2022-12-12 ENCOUNTER — Other Ambulatory Visit: Payer: Self-pay

## 2022-12-12 MED ORDER — NORETHIN ACE-ETH ESTRAD-FE 1-20 MG-MCG(24) PO CAPS
1.0000 | ORAL_CAPSULE | Freq: Every day | ORAL | 0 refills | Status: DC
  Filled 2022-12-12: qty 28, 28d supply, fill #0

## 2022-12-17 ENCOUNTER — Other Ambulatory Visit (HOSPITAL_BASED_OUTPATIENT_CLINIC_OR_DEPARTMENT_OTHER): Payer: Self-pay

## 2022-12-17 DIAGNOSIS — Z113 Encounter for screening for infections with a predominantly sexual mode of transmission: Secondary | ICD-10-CM | POA: Diagnosis not present

## 2022-12-17 DIAGNOSIS — Z01411 Encounter for gynecological examination (general) (routine) with abnormal findings: Secondary | ICD-10-CM | POA: Diagnosis not present

## 2022-12-17 DIAGNOSIS — R87611 Atypical squamous cells cannot exclude high grade squamous intraepithelial lesion on cytologic smear of cervix (ASC-H): Secondary | ICD-10-CM | POA: Diagnosis not present

## 2022-12-17 DIAGNOSIS — Z6821 Body mass index (BMI) 21.0-21.9, adult: Secondary | ICD-10-CM | POA: Diagnosis not present

## 2022-12-17 DIAGNOSIS — B977 Papillomavirus as the cause of diseases classified elsewhere: Secondary | ICD-10-CM | POA: Diagnosis not present

## 2022-12-17 DIAGNOSIS — R87619 Unspecified abnormal cytological findings in specimens from cervix uteri: Secondary | ICD-10-CM | POA: Diagnosis not present

## 2022-12-17 DIAGNOSIS — Z124 Encounter for screening for malignant neoplasm of cervix: Secondary | ICD-10-CM | POA: Diagnosis not present

## 2022-12-17 DIAGNOSIS — Z01419 Encounter for gynecological examination (general) (routine) without abnormal findings: Secondary | ICD-10-CM | POA: Diagnosis not present

## 2022-12-17 DIAGNOSIS — Z3041 Encounter for surveillance of contraceptive pills: Secondary | ICD-10-CM | POA: Diagnosis not present

## 2022-12-17 MED ORDER — NORETHINDRONE ACET-ETHINYL EST 1-20 MG-MCG PO TABS
1.0000 | ORAL_TABLET | Freq: Every day | ORAL | 3 refills | Status: DC
  Filled 2022-12-17: qty 84, 84d supply, fill #0
  Filled 2023-03-15 – 2023-04-21 (×2): qty 84, 84d supply, fill #1
  Filled 2023-07-21 – 2023-07-29 (×2): qty 84, 84d supply, fill #2
  Filled 2023-11-16: qty 84, 84d supply, fill #3

## 2023-01-24 IMAGING — MG MM DIGITAL DIAGNOSTIC UNILAT*L* W/ TOMO W/ CAD
6 series · 6 of 18 positions shown · non-contrast
Comparison: 01/01/2022 baseline screening mammogram

CLINICAL DATA: 45-year-old female for further evaluation of LEFT
breast mass identified on baseline screening mammogram.

EXAM:
DIGITAL DIAGNOSTIC UNILATERAL LEFT MAMMOGRAM WITH TOMOSYNTHESIS AND
CAD; ULTRASOUND LEFT BREAST LIMITED
TECHNIQUE: Left digital diagnostic mammography and breast tomosynthesis was
performed. The images were evaluated with computer-aided detection.;
Targeted ultrasound examination of the left breast was performed.

[L ML synth-2D]
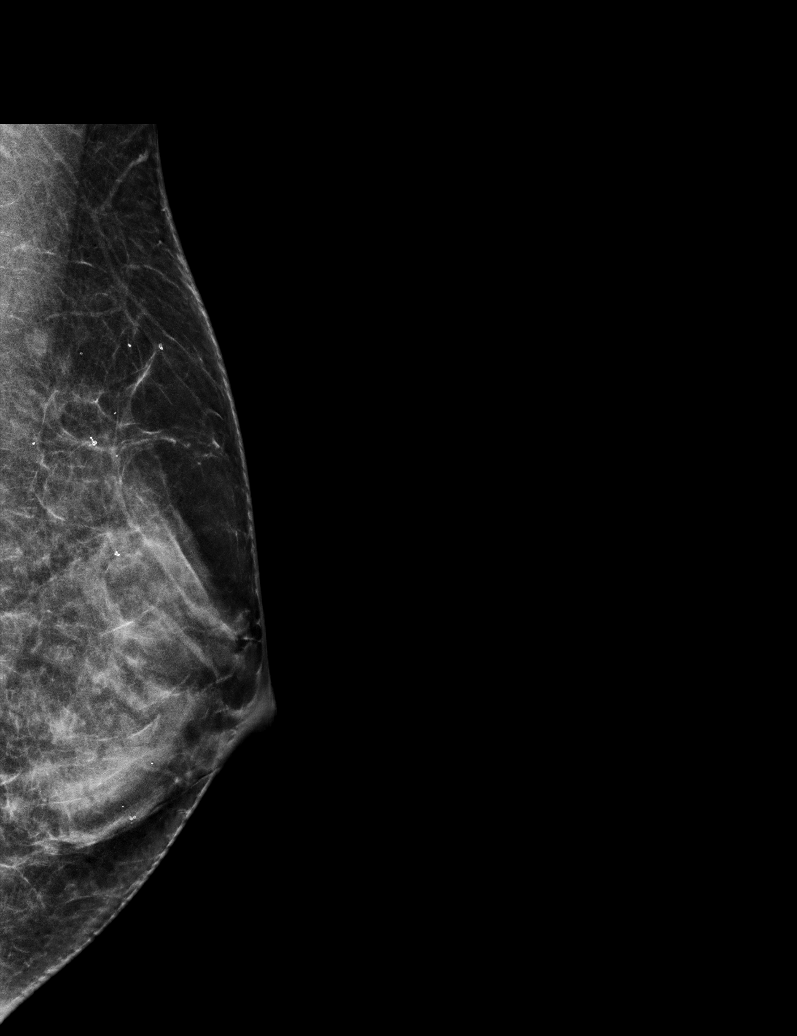

[L XCCL synth-2D]
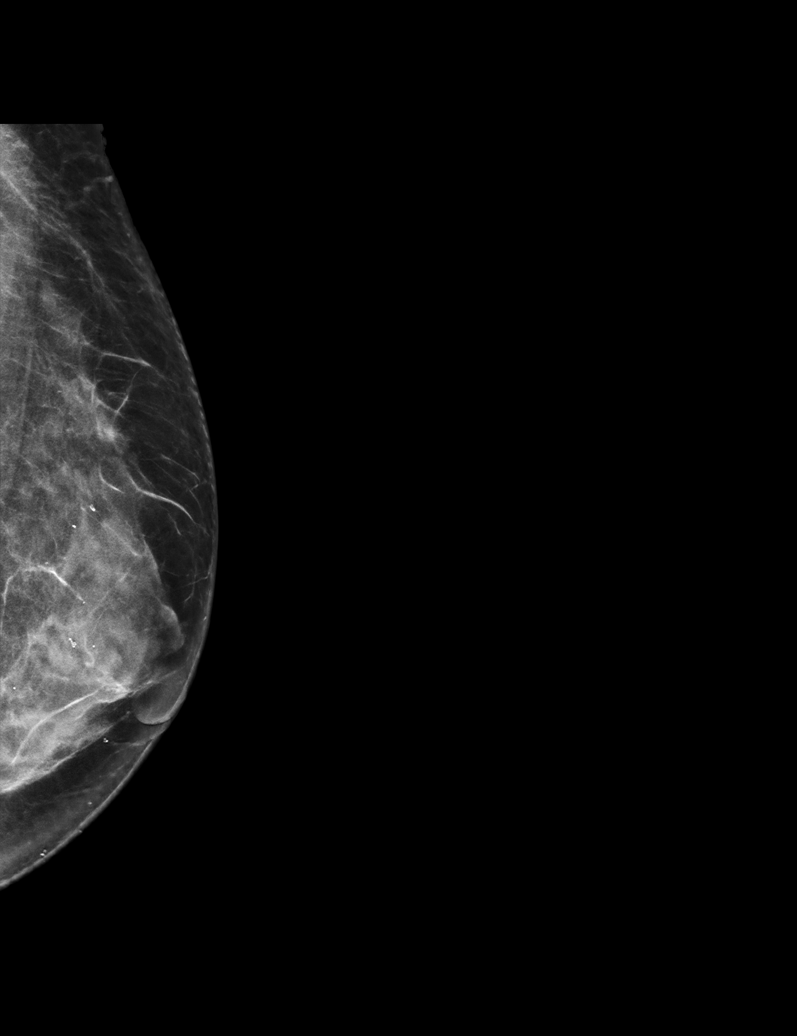

[L MLO synth-2D]
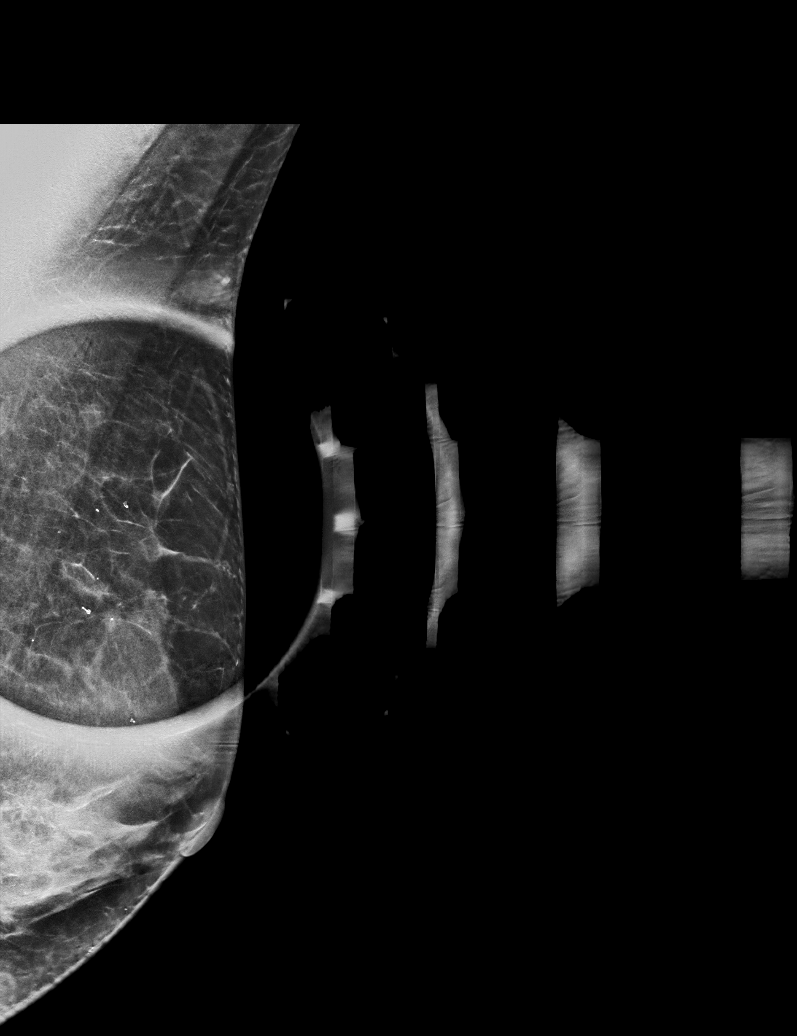

[L MLO tomo · tomo slice 30/59.0]
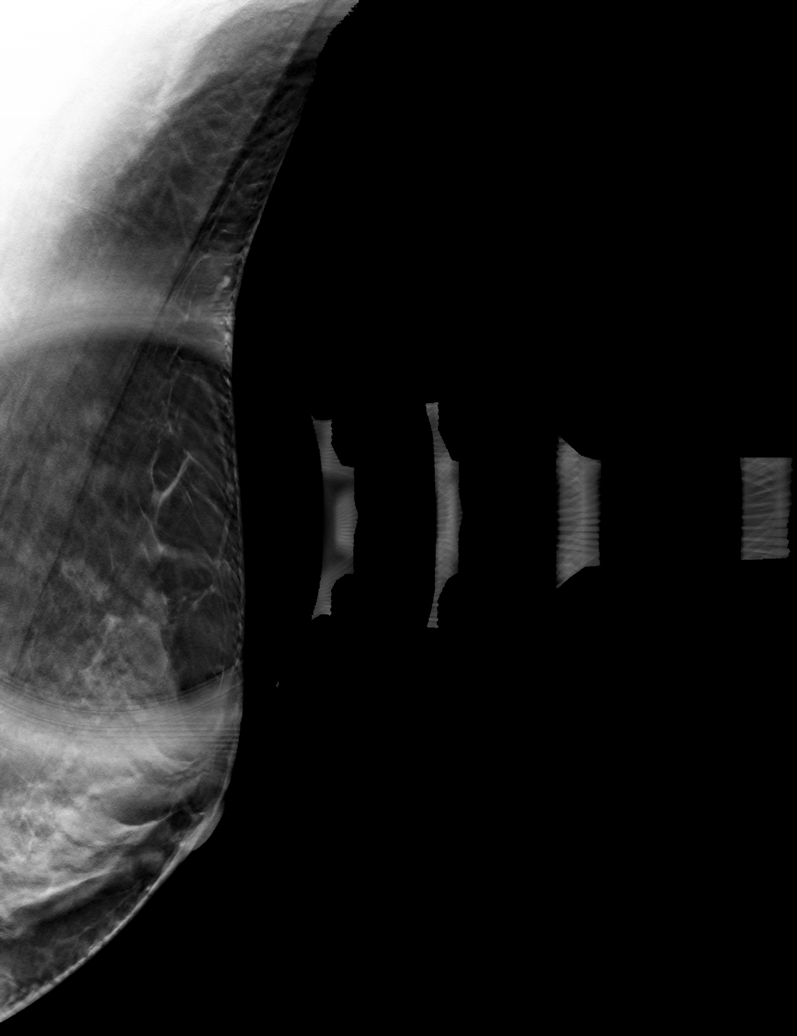

[L ML tomo · tomo slice 28/55.0]
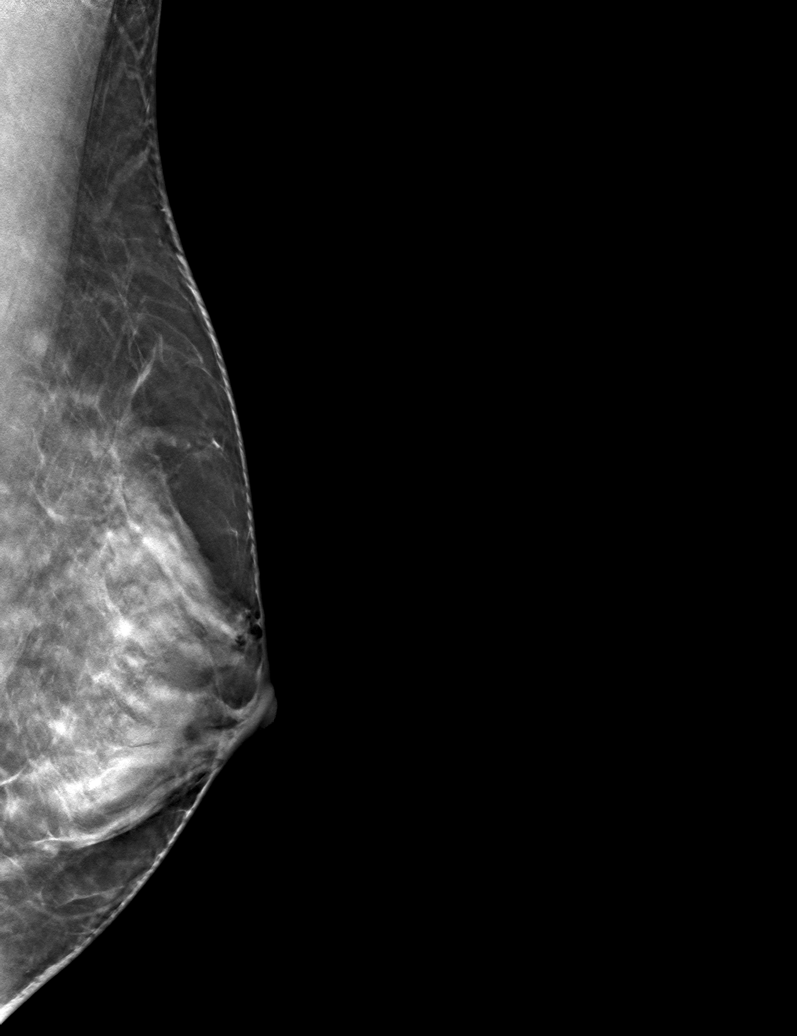

[L XCCL tomo · tomo slice 32/63.0]
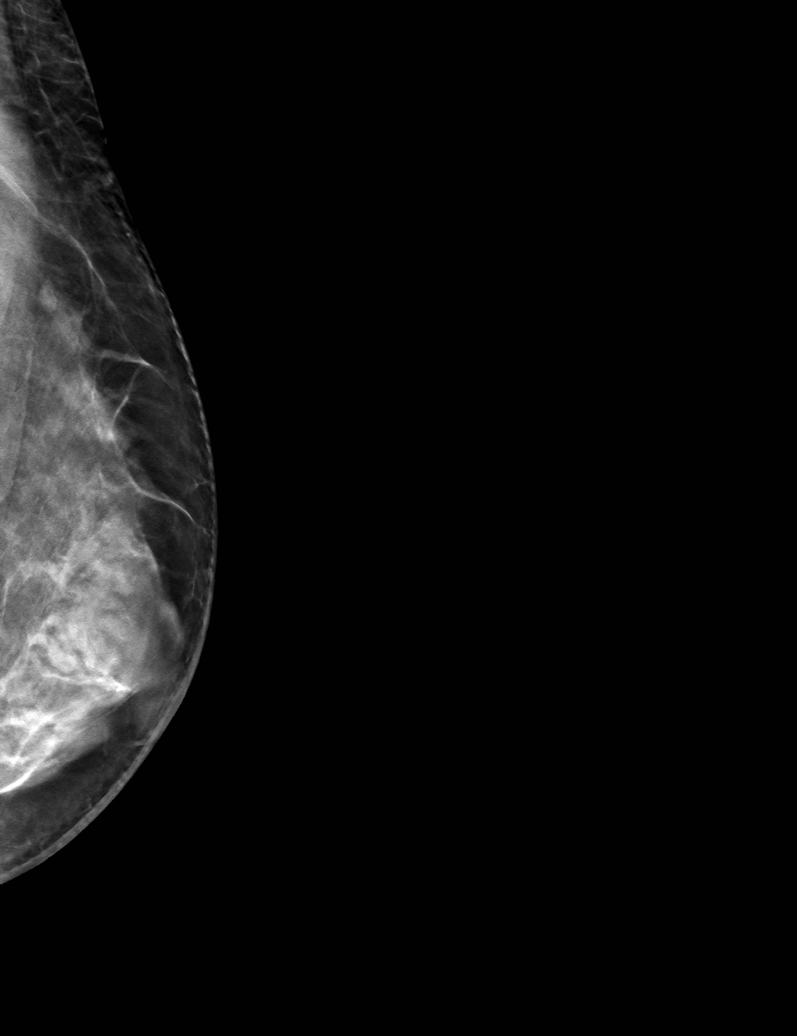

[6 of 18 positions shown; findings below may reference images not displayed]

ACR Breast Density Category d: The breast tissue is extremely dense,
which lowers the sensitivity of mammography.
FINDINGS: 2D/3D full field and spot compression views of the LEFT breast
demonstrate a persistent circumscribed oval mass within the far
UPPER OUTER LEFT breast.

Targeted ultrasound is performed, showing a 0.4 x 0.2 x 0.5 cm
circumscribed oval hypoechoic parallel mass with a possible small
fatty hilum at the 1 o'clock position of the LEFT breast 8 cm from
the nipple.

Normal appearing LEFT axillary lymph nodes are noted.
IMPRESSION: 1. 0.5 cm likely benign mass in the far UPPER-OUTER LEFT
breast/LOWER LEFT axilla, probably a normal lymph node. We discussed
management options including ultrasound-guided core biopsy and close
follow-up. Follow-up ultrasound is recommended at 6, 12, and 24
months to assess stability. The patient concurs with this plan.

RECOMMENDATION:
LEFT breast ultrasound in 6 months.

I have discussed the findings and recommendations with the patient.
If applicable, a reminder letter will be sent to the patient
regarding the next appointment.

BI-RADS CATEGORY  3: Probably benign.

## 2023-02-11 DIAGNOSIS — D069 Carcinoma in situ of cervix, unspecified: Secondary | ICD-10-CM | POA: Diagnosis not present

## 2023-02-11 DIAGNOSIS — R87619 Unspecified abnormal cytological findings in specimens from cervix uteri: Secondary | ICD-10-CM | POA: Diagnosis not present

## 2023-02-11 DIAGNOSIS — R87810 Cervical high risk human papillomavirus (HPV) DNA test positive: Secondary | ICD-10-CM | POA: Diagnosis not present

## 2023-02-11 DIAGNOSIS — R87611 Atypical squamous cells cannot exclude high grade squamous intraepithelial lesion on cytologic smear of cervix (ASC-H): Secondary | ICD-10-CM | POA: Diagnosis not present

## 2023-02-13 ENCOUNTER — Ambulatory Visit
Admission: RE | Admit: 2023-02-13 | Discharge: 2023-02-13 | Disposition: A | Discharge status: Home / Self Care | Payer: Commercial Managed Care - PPO | Source: Ambulatory Visit | Attending: Obstetrics and Gynecology | Admitting: Obstetrics and Gynecology

## 2023-02-13 ENCOUNTER — Ambulatory Visit
Admission: RE | Admit: 2023-02-13 | Discharge: 2023-02-13 | Disposition: A | Discharge status: Home / Self Care | Payer: 59 | Source: Ambulatory Visit | Attending: Obstetrics and Gynecology | Admitting: Obstetrics and Gynecology

## 2023-02-13 DIAGNOSIS — N6321 Unspecified lump in the left breast, upper outer quadrant: Secondary | ICD-10-CM | POA: Diagnosis not present

## 2023-02-13 DIAGNOSIS — R922 Inconclusive mammogram: Secondary | ICD-10-CM | POA: Diagnosis not present

## 2023-02-13 DIAGNOSIS — R599 Enlarged lymph nodes, unspecified: Secondary | ICD-10-CM

## 2023-03-11 ENCOUNTER — Other Ambulatory Visit: Payer: Self-pay | Admitting: Podiatrist

## 2023-03-11 DIAGNOSIS — R87610 Atypical squamous cells of undetermined significance on cytologic smear of cervix (ASC-US): Secondary | ICD-10-CM | POA: Diagnosis not present

## 2023-03-11 DIAGNOSIS — N871 Moderate cervical dysplasia: Secondary | ICD-10-CM | POA: Diagnosis not present

## 2023-03-11 DIAGNOSIS — N870 Mild cervical dysplasia: Secondary | ICD-10-CM | POA: Diagnosis not present

## 2023-03-13 ENCOUNTER — Other Ambulatory Visit (HOSPITAL_BASED_OUTPATIENT_CLINIC_OR_DEPARTMENT_OTHER): Payer: Self-pay

## 2023-03-13 ENCOUNTER — Encounter (HOSPITAL_BASED_OUTPATIENT_CLINIC_OR_DEPARTMENT_OTHER): Payer: Self-pay

## 2023-03-15 ENCOUNTER — Other Ambulatory Visit: Payer: Self-pay

## 2023-03-17 ENCOUNTER — Encounter: Payer: Self-pay | Admitting: Physician Assistant

## 2023-03-17 ENCOUNTER — Ambulatory Visit (INDEPENDENT_AMBULATORY_CARE_PROVIDER_SITE_OTHER): Payer: Commercial Managed Care - PPO | Admitting: Physician Assistant

## 2023-03-17 ENCOUNTER — Other Ambulatory Visit (HOSPITAL_BASED_OUTPATIENT_CLINIC_OR_DEPARTMENT_OTHER): Payer: Self-pay

## 2023-03-17 DIAGNOSIS — B351 Tinea unguium: Secondary | ICD-10-CM | POA: Diagnosis not present

## 2023-03-17 MED ORDER — TERBINAFINE HCL 250 MG PO TABS
250.0000 mg | ORAL_TABLET | Freq: Every day | ORAL | 1 refills | Status: DC
  Filled 2023-03-17: qty 90, 90d supply, fill #0
  Filled 2023-07-21 – 2023-07-29 (×2): qty 90, 90d supply, fill #1

## 2023-03-17 NOTE — Patient Instructions (Signed)

## 2023-03-17 NOTE — Progress Notes (Signed)
   Acute Office Visit  Subjective:     Patient ID: Hali Marry, MD, female    DOB: 24-Oct-1976, 47 y.o.   MRN: [id number]  No chief complaint on file.   HPI Patient is in today to follow up on bilateral great toenail fungus. She did not like that on once daily Lamisil she spotted. She was then placed on pulse dose. She has noticed some healthy nail on her right toenail but she is frustrated with progress and would like start daily lamisil again despite the spotting.    .. 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      Objective:    There were no vitals taken for this visit. BP Readings from Last 3 Encounters:  05/23/22 113/70  05/18/19 (!) 106/56  01/25/18 124/63   Wt Readings from Last 3 Encounters:  05/23/22 150 lb (68 kg)  05/02/22 150 lb (68 kg)  05/18/19 147 lb (66.7 kg)      Physical Exam Bilateral great toenails are thick yellow/brown and dystrophic. At the base of right toenail healthy nail is growing.      Assessment & Plan:  Marland KitchenMarland KitchenDiagnoses and all orders for this visit:  Onychomycosis -     terbinafine (LAMISIL) 250 MG tablet; Take 1 tablet (250 mg total) by mouth daily.   Start daily lamisil  for 3-6 months.  Liver enzymes look good.  HO given for prevention.   Iran Planas, PA-C

## 2023-03-27 DIAGNOSIS — R87611 Atypical squamous cells cannot exclude high grade squamous intraepithelial lesion on cytologic smear of cervix (ASC-H): Secondary | ICD-10-CM | POA: Diagnosis not present

## 2023-04-21 ENCOUNTER — Other Ambulatory Visit: Payer: Self-pay

## 2023-07-22 ENCOUNTER — Other Ambulatory Visit (HOSPITAL_COMMUNITY): Payer: Self-pay

## 2023-07-22 ENCOUNTER — Other Ambulatory Visit: Payer: Self-pay

## 2023-07-25 ENCOUNTER — Other Ambulatory Visit: Payer: Self-pay

## 2023-07-27 ENCOUNTER — Other Ambulatory Visit: Payer: Self-pay

## 2023-07-29 ENCOUNTER — Other Ambulatory Visit: Payer: Self-pay

## 2023-07-29 ENCOUNTER — Other Ambulatory Visit (HOSPITAL_COMMUNITY): Payer: Self-pay

## 2023-07-30 ENCOUNTER — Other Ambulatory Visit: Payer: Self-pay

## 2023-07-31 DIAGNOSIS — R87611 Atypical squamous cells cannot exclude high grade squamous intraepithelial lesion on cytologic smear of cervix (ASC-H): Secondary | ICD-10-CM | POA: Diagnosis not present

## 2023-07-31 DIAGNOSIS — N871 Moderate cervical dysplasia: Secondary | ICD-10-CM | POA: Diagnosis not present

## 2023-08-26 DIAGNOSIS — H5203 Hypermetropia, bilateral: Secondary | ICD-10-CM | POA: Diagnosis not present

## 2023-09-18 ENCOUNTER — Ambulatory Visit: Payer: Commercial Managed Care - PPO | Admitting: Podiatry

## 2023-09-18 DIAGNOSIS — B351 Tinea unguium: Secondary | ICD-10-CM

## 2023-09-18 NOTE — Progress Notes (Signed)
  Subjective:  Patient ID: Agapito Games, MD, female    DOB: 09-18-1976,   MRN: [id number]  No chief complaint on file.   47 y.o. female presents for concern of right hallux nail thickness changes. She was seen about a year ago with Dr. Irving Shows and started treatment with lamisil. Cultures had been positive for fungus. Relates the left toe got better but the right toe she is not sure if it has been improving. Denies any pain . Denies any other pedal complaints. Denies n/v/f/c.   Past Medical History:  Diagnosis Date   GERD (gastroesophageal reflux disease)    Post-operative nausea and vomiting     Objective:  Physical Exam: Vascular: DP/PT pulses 2/4 bilateral. CFT <3 seconds. Normal hair growth on digits. No edema.  Skin. No lacerations or abrasions bilateral feet. Right hallux nail dystrophic and thickened with subungual debris. Left hallux nail distally some dystrophic changes but proximal nail appears normal.  Musculoskeletal: MMT 5/5 bilateral lower extremities in DF, PF, Inversion and Eversion. Deceased ROM in DF of ankle joint.  Neurological: Sensation intact to light touch.     Assessment:   1. Onychomycosis      Plan:  Patient was evaluated and treated and all questions answered. -Examined patient -Discussed treatment options for painful dystrophic nails  -Cultures previous growing fungus  -Discussed fungal nail treatment options.  -will continue on lamisil for now for about another month and see if any improvement in the nails. If no improvement then will consider removal of the nail to restart clean growth.  -Patient to return as needed.     Louann Sjogren, DPM

## 2023-11-17 ENCOUNTER — Other Ambulatory Visit: Payer: Self-pay

## 2023-12-09 ENCOUNTER — Other Ambulatory Visit (HOSPITAL_BASED_OUTPATIENT_CLINIC_OR_DEPARTMENT_OTHER): Payer: Self-pay

## 2023-12-09 DIAGNOSIS — R87611 Atypical squamous cells cannot exclude high grade squamous intraepithelial lesion on cytologic smear of cervix (ASC-H): Secondary | ICD-10-CM | POA: Diagnosis not present

## 2023-12-09 DIAGNOSIS — N871 Moderate cervical dysplasia: Secondary | ICD-10-CM | POA: Diagnosis not present

## 2023-12-09 MED ORDER — NORETHINDRONE ACET-ETHINYL EST 1-20 MG-MCG PO TABS
1.0000 | ORAL_TABLET | Freq: Every day | ORAL | 3 refills | Status: DC
  Filled 2023-12-09 – 2024-02-06 (×2): qty 84, 84d supply, fill #0
  Filled 2024-04-28: qty 84, 84d supply, fill #1
  Filled 2024-07-21: qty 84, 84d supply, fill #2

## 2024-02-08 ENCOUNTER — Other Ambulatory Visit: Payer: Self-pay

## 2024-03-09 DIAGNOSIS — Z1231 Encounter for screening mammogram for malignant neoplasm of breast: Secondary | ICD-10-CM

## 2024-03-14 ENCOUNTER — Other Ambulatory Visit: Payer: Self-pay | Admitting: Obstetrics and Gynecology

## 2024-03-14 DIAGNOSIS — N6320 Unspecified lump in the left breast, unspecified quadrant: Secondary | ICD-10-CM

## 2024-03-23 ENCOUNTER — Other Ambulatory Visit (HOSPITAL_COMMUNITY): Payer: Self-pay

## 2024-03-23 DIAGNOSIS — Z1331 Encounter for screening for depression: Secondary | ICD-10-CM | POA: Diagnosis not present

## 2024-03-23 DIAGNOSIS — N871 Moderate cervical dysplasia: Secondary | ICD-10-CM | POA: Diagnosis not present

## 2024-03-23 DIAGNOSIS — Z01419 Encounter for gynecological examination (general) (routine) without abnormal findings: Secondary | ICD-10-CM | POA: Diagnosis not present

## 2024-03-23 MED ORDER — NORETHINDRONE ACET-ETHINYL EST 1-20 MG-MCG PO TABS
1.0000 | ORAL_TABLET | Freq: Every day | ORAL | 3 refills | Status: AC
  Filled 2024-10-19: qty 84, 84d supply, fill #0
  Filled 2025-01-10: qty 84, 84d supply, fill #1

## 2024-03-24 ENCOUNTER — Other Ambulatory Visit (HOSPITAL_COMMUNITY): Payer: Self-pay

## 2024-04-01 ENCOUNTER — Encounter

## 2024-04-01 LAB — LAB REPORT - SCANNED
A1c: 5.5
EGFR: 95

## 2024-04-22 ENCOUNTER — Ambulatory Visit
Admission: RE | Admit: 2024-04-22 | Discharge: 2024-04-22 | Disposition: A | Discharge status: Home / Self Care | Source: Ambulatory Visit | Attending: Obstetrics and Gynecology | Admitting: Obstetrics and Gynecology

## 2024-04-22 DIAGNOSIS — N6320 Unspecified lump in the left breast, unspecified quadrant: Secondary | ICD-10-CM

## 2024-04-22 DIAGNOSIS — N6321 Unspecified lump in the left breast, upper outer quadrant: Secondary | ICD-10-CM | POA: Diagnosis not present

## 2024-04-28 ENCOUNTER — Other Ambulatory Visit: Payer: Self-pay | Admitting: Physician Assistant

## 2024-04-28 ENCOUNTER — Other Ambulatory Visit: Payer: Self-pay

## 2024-04-28 ENCOUNTER — Other Ambulatory Visit (HOSPITAL_COMMUNITY): Payer: Self-pay

## 2024-04-28 DIAGNOSIS — B351 Tinea unguium: Secondary | ICD-10-CM

## 2024-04-28 MED ORDER — TERBINAFINE HCL 250 MG PO TABS
250.0000 mg | ORAL_TABLET | Freq: Every day | ORAL | 0 refills | Status: AC
  Filled 2024-04-28: qty 90, 90d supply, fill #0

## 2024-07-22 ENCOUNTER — Other Ambulatory Visit (HOSPITAL_COMMUNITY): Payer: Self-pay

## 2024-08-26 DIAGNOSIS — H5203 Hypermetropia, bilateral: Secondary | ICD-10-CM | POA: Diagnosis not present

## 2024-10-19 ENCOUNTER — Other Ambulatory Visit (HOSPITAL_BASED_OUTPATIENT_CLINIC_OR_DEPARTMENT_OTHER): Payer: Self-pay

## 2024-10-19 ENCOUNTER — Ambulatory Visit (INDEPENDENT_AMBULATORY_CARE_PROVIDER_SITE_OTHER): Admitting: Physician Assistant

## 2024-10-19 VITALS — BP 131/74 | HR 67 | Ht 69.0 in | Wt 150.0 lb

## 2024-10-19 DIAGNOSIS — L821 Other seborrheic keratosis: Secondary | ICD-10-CM | POA: Diagnosis not present

## 2024-10-19 DIAGNOSIS — R238 Other skin changes: Secondary | ICD-10-CM

## 2024-10-19 DIAGNOSIS — L819 Disorder of pigmentation, unspecified: Secondary | ICD-10-CM

## 2024-10-19 MED ORDER — TRETINOIN 0.1 % EX CREA
TOPICAL_CREAM | Freq: Every day | CUTANEOUS | 1 refills | Status: AC
  Filled 2024-10-19: qty 20, 30d supply, fill #0
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
## 2024-10-20 ENCOUNTER — Other Ambulatory Visit: Payer: Self-pay

## 2024-10-24 ENCOUNTER — Encounter: Payer: Self-pay | Admitting: Physician Assistant

## 2024-10-24 DIAGNOSIS — L819 Disorder of pigmentation, unspecified: Secondary | ICD-10-CM | POA: Insufficient documentation

## 2024-10-24 DIAGNOSIS — R238 Other skin changes: Secondary | ICD-10-CM | POA: Insufficient documentation

## 2025-01-11 ENCOUNTER — Other Ambulatory Visit: Payer: Self-pay

## 2025-01-27 ENCOUNTER — Ambulatory Visit: Admitting: Podiatry

## 2025-01-27 ENCOUNTER — Encounter: Payer: Self-pay | Admitting: Podiatry

## 2025-01-27 DIAGNOSIS — B351 Tinea unguium: Secondary | ICD-10-CM

## 2025-01-27 NOTE — Addendum Note (Signed)
 Addended by: WAYLAN ELIDIA PARAS on: 01/27/2025 11:50 AM   Modules accepted: Orders

## 2025-01-27 NOTE — Progress Notes (Signed)
"  °  Subjective:  Patient ID: Brittany JONETTA Byars, MD, female    DOB: 1976/01/07,   MRN: [id number]  Chief Complaint  Patient presents with   Nail Problem    It's the toenails.  She did a culture before.  She started me on Lamisil  before.  So, I'm here for a follow-up.    49 y.o. female presents for continued concern of fungal nails.  She relates her left 1 is doing about the same but worried that the right one is getting worse.  Previously she has had the nails cultured and was positive for fungus she has been on Lamisil  she relates she has been taking it fairly consistently at the beginning of every month.  She relates she has not noticed much difference and feel like the nails are growing very slow.. Denies any other pedal complaints. Denies n/v/f/c.   Past Medical History:  Diagnosis Date   GERD (gastroesophageal reflux disease)    Post-operative nausea and vomiting     Objective:  Physical Exam: Vascular: DP/PT pulses 2/4 bilateral. CFT <3 seconds. Normal hair growth on digits. No edema.  Skin. No lacerations or abrasions bilateral feet. Right hallux nail dystrophic and thickened with subungual debris. Left hallux nail distally some dystrophic changes but proximal nail appears normal.  Musculoskeletal: MMT 5/5 bilateral lower extremities in DF, PF, Inversion and Eversion. Deceased ROM in DF of ankle joint.  Neurological: Sensation intact to light touch.     Assessment:   1. Onychomycosis       Plan:  Patient was evaluated and treated and all questions answered. -Examined patient -Discussed treatment options for painful dystrophic nails  -Clinical picture and Fungal culture was obtained by removing a portion of the hard nail itself from each of the involved toenails using a sterile nail nipper and sent to [facility] lab. Patient tolerated the biopsy procedure well without discomfort or need for anesthesia.  -Discussed fungal nail treatment options including oral, topical, and laser  treatments.  -Patient to return in 4 weeks for follow up evaluation and discussion of fungal culture results or sooner if symptoms worsen.    Asberry Failing, DPM    "
